# Patient Record
Sex: Female | Born: 1985 | Race: White | Hispanic: No | Marital: Married | Smoking: Current every day smoker
Health system: Southern US, Community
[De-identification: ages and names within clinical notes are randomized; demographics above are authoritative.]

## PROBLEM LIST (undated history)

## (undated) HISTORY — PX: WISDOM TOOTH EXTRACTION: SHX21

## (undated) HISTORY — PX: TUBAL LIGATION: SHX77

---
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
## 2004-09-13 ENCOUNTER — Emergency Department (HOSPITAL_COMMUNITY): Admission: EM | Admit: 2004-09-13 | Discharge: 2004-09-13 | Payer: Self-pay | Admitting: Emergency Medicine

## 2004-10-16 ENCOUNTER — Emergency Department (HOSPITAL_COMMUNITY): Admission: EM | Admit: 2004-10-16 | Discharge: 2004-10-16 | Payer: Self-pay | Admitting: Emergency Medicine

## 2005-02-02 ENCOUNTER — Inpatient Hospital Stay (HOSPITAL_COMMUNITY): Admission: AD | Admit: 2005-02-02 | Discharge: 2005-02-02 | Payer: Self-pay | Admitting: Obstetrics & Gynecology

## 2005-03-12 ENCOUNTER — Emergency Department (HOSPITAL_COMMUNITY): Admission: EM | Admit: 2005-03-12 | Discharge: 2005-03-12 | Payer: Self-pay | Admitting: Emergency Medicine

## 2005-03-25 ENCOUNTER — Other Ambulatory Visit: Admission: RE | Admit: 2005-03-25 | Discharge: 2005-03-25 | Payer: Self-pay | Admitting: Obstetrics and Gynecology

## 2005-08-14 ENCOUNTER — Inpatient Hospital Stay (HOSPITAL_COMMUNITY): Admission: AD | Admit: 2005-08-14 | Discharge: 2005-08-14 | Payer: Self-pay | Admitting: Obstetrics and Gynecology

## 2005-08-19 ENCOUNTER — Inpatient Hospital Stay (HOSPITAL_COMMUNITY): Admission: AD | Admit: 2005-08-19 | Discharge: 2005-08-22 | Payer: Self-pay | Admitting: Obstetrics and Gynecology

## 2005-10-27 ENCOUNTER — Ambulatory Visit (HOSPITAL_COMMUNITY): Admission: RE | Admit: 2005-10-27 | Discharge: 2005-10-27 | Payer: Self-pay | Admitting: Obstetrics and Gynecology

## 2007-03-16 ENCOUNTER — Emergency Department (HOSPITAL_COMMUNITY): Admission: EM | Admit: 2007-03-16 | Discharge: 2007-03-16 | Payer: Self-pay | Admitting: Emergency Medicine

## 2009-08-03 ENCOUNTER — Emergency Department (HOSPITAL_COMMUNITY): Admission: EM | Admit: 2009-08-03 | Discharge: 2009-08-03 | Payer: Self-pay | Admitting: Emergency Medicine

## 2010-07-26 NOTE — Op Note (Signed)
NAMEJOANELL, CRESSLER           ACCOUNT NO.:  [id number]   MEDICAL RECORD NO.:  [id number]          PATIENT TYPE:  AMB   LOCATION:  SDC                           FACILITY:  [facility]   PHYSICIAN:  Duke Salvia. Marcelle Overlie, M.D.DATE OF BIRTH:  06-14-1985   DATE OF PROCEDURE:  10/27/2005  DATE OF DISCHARGE:                                 OPERATIVE REPORT   PREOPERATIVE DIAGNOSIS:  Request for permanent sterilization.   POSTOPERATIVE DIAGNOSIS:  Request for permanent sterilization.   PROCEDURE:  Laparoscopic tubal ligation by Filshie clip application.   SURGEON:  [place].   ANESTHESIA:  General endotracheal.   COMPLICATIONS:  None.   DRAINS:  In and out Foley.   BLOOD LOSS:  Minimal.   SPECIMENS REMOVED:  None.   PROCEDURE AND FINDINGS:  The patient was taken to the operating room after  an adequate level of general endotracheal anesthesia was obtained with the  patient legs in stirrups.  The abdomen, perineum and vagina were prepped and  draped.  Bladder was drained with an in-and-out Foley, EUA carried out.  Uterus normal size, mid position, mobile, adnexa negative.  Hulka tenaculum  was positioned.  Attention directed to the subumbilical area.  Small amount  of Marcaine was injected.  A small incision was made, and the Veress needle  was introduced without difficulty.  Its intra-abdominal position verified by  pressure and water testing.  After 2-1/2 liter pneumoperitoneum was then  created, laparoscopic trocar and sleeve were then introduced without  difficulty.  There was no evidence any bleeding or trauma.  The patient was  then placed in Trendelenburg.  Pelvis was inspected and noted be normal.  Forty-five mL of half percent Marcaine plain was then dripped across the  tube from the cornu to the fimbriated end.  Filshie clip applicator was back  loaded.  The right tube was identified, traced to its fimbriated end.  It  was regrasped 2 cm from the cornu and a clip applied and a  right angle,  completely engulfing the tube with excellent application and the exact same  repeated on the opposite side.  Photos were taken for documentation.  There  was no bleeding.  Instruments were removed.  The gas was allowed to escape.  Defect closed with 4-0 Dexon subcuticular suture.  She tolerated this well  and went to recovery room in good condition.     Richard M. Marcelle Overlie, M.D.  Electronically Signed    [facility]/MEDQ  D:  10/27/2005  T:  10/27/2005  Job:  [id number]

## 2010-07-26 NOTE — H&P (Signed)
Sherri Oliver, SLAWSON                     ACCOUNT NO.:  [id number]   MEDICAL RECORD NO.:  [id number]                   PATIENT TYPE:  INP   LOCATION:  1829                                 FACILITY:  [facility]   PHYSICIAN:  Sherri Oliver, M.D.                 DATE OF BIRTH:  01/04/1986   DATE OF ADMISSION:  12/22/2002  DATE OF DISCHARGE:                                HISTORY & PHYSICAL   CHIEF COMPLAINT:  Right lower quadrant pain, fever and chills.   HISTORY OF PRESENT ILLNESS:  Sherri Oliver is an 25 year old woman with a  past medical history significant for pyelonephritis and bipolar disorder,  who presented to the emergency department with a two-and-a-half-day history  of right-sided lower abdominal pain that radiated to the back, fever and  chills.  She started having nausea and vomiting x2-3 earlier today.  She  almost passed out at work today after getting very dizzy and weak.  She  denies any painful urination, however, she has had an increase in urinary  frequency.  She has not had urinary urgency.  She denies any vaginal  discharge or pain with urination.  The pain in her right lower abdomen was  ranked as 7-8/10 earlier; now it is approximately 5/10.  The pain  intensifies during the exam but when she is lying still, it is not quite as  bad.  She was able to drink the water at work today and she was able to keep  water down.  Surprisingly, the patient worked at [employer]  throughout the course of the day, even with the fever, chills, nausea,  vomiting and right-sided abdominal pain.  Her nausea and vomiting actually  started at approximately 8:30 the morning of December 21, 2002.  The patient  has not had any complaints of stiff neck, chest pain, upper respiratory  infection symptoms, shortness of breath, diarrhea, melena, hematochezia,  bloody urine or painful urination.  She denies any depression recently.  She  denies any suicidal thoughts recently.  She  denies any painful sex.  Her  review of systems is positive for irregular periods.  She generally spots  for a few days each month.  She delivered her first and only baby in April  of 2003.  She is followed by gynecologist, Dr. Duke Oliver. [place].   When the patient was evaluated in the emergency department, a CT scan of the  abdomen and pelvis was ordered.  Per the emergency department physician, Dr.  Radford Oliver, Dr. Thornton Oliver. [facility] Oliver, Careers adviser, was consulted.  Dr. Daphine Oliver evaluated  the CT scan with the radiologist.  Their assessment was that the patient had  changes consistent with right pyelonephritis.  Her temperature on  presentation was 103.2.  Her white blood cell count was 12.2.  Her  urinalysis was positive for large hemoglobin, greater than 80 ketones, large  leukocyte esterase, wbc's too numerous to count, and many  bacteria.  The  patient will be therefore admitted for evaluation and management of  pyelonephritis.   PAST MEDICAL HISTORY:  1. History of pyelonephritis in the past.  2. History of bipolar disorder.     a. Attempted suicide with Depakote and other medicines in the past (two        to three years ago).     b. Inpatient treatment at [facility] two to three years ago.  3. History of fractured coccyx in the past.  4. Gravida I, para I in April of 2003.   MEDICATIONS:  1. Depo-Provera every (q) 3 months.  2. Ibuprofen as needed.   ALLERGIES:  DEPAKOTE.   SOCIAL HISTORY:  The patient is single.  She is employed at [employer].  She [place]  The father of the baby and her son live there as well.  She also  resides with her two brothers and one sister.  She completed the 8th grade.  She can read and write.  She denies tobacco, alcohol and drug use.   FAMILY HISTORY:  Her mother is 77 years of age and has no significant past  medical history.  The health of her biological father is  unknown.   REVIEW OF SYSTEMS:  Review of systems as above in the history of present  illness.  In addition, the patient denies hematemesis or coffee-grounds  emesis.   EXAMINATION:  VITAL SIGNS:  Temperature 103.2, pulse 132, respiratory rate  18, blood pressure 112/65, oxygen saturation 96% on room air.  GENERAL:  The patient is an 25 year old white female of average weight, who  is alert and appears ill.  She currently has chills.  HEENT:  Head is normocephalic and nontraumatic.  Pupils are equal, round and  reactive to light.  Extraocular movement are intact.  Tympanic membranes are  clear bilaterally.  Nasal mucosa is moist, no drainage.  Oropharynx reveals  mildly dry mucous membranes.  She does have a pierced tongue.  Dentition is  good.  No posterior exudates or erythema.  NECK:  Neck is supple.  No adenopathy, no thyromegaly.  LUNGS:  Lungs are clear to auscultation bilaterally.  HEART:  S1, S2, with mild tachycardia.  ABDOMEN:  Hypoactive bowel sounds.  She has moderate right lower quadrant  pain as well as moderate right flank pain.  She is nontender elsewhere.  No  masses palpated.  No rigidity nor rebound.  Her abdomen, in general, is  soft.  GU:  Deferred.  RECTAL:  Deferred.  EXTREMITIES:  The patient has no joint abnormalities throughout.  Pedal  pulses are 2+ bilaterally.  No pretibial edema and no pedal edema.  Good  movement of all joints.  NEUROLOGIC:  The patient is alert and oriented x3.  Cranial nerves II-XII  are intact.  Strength is intact, 5/5 throughout.  Sensation is intact  throughout.   ADMISSION LABORATORIES:  CT scan of the abdomen and pelvis:  Preliminary  reading per Dr. Radford Oliver, changes consistent with right pyelonephritis.  A  final reading is pending per radiologist.   Urine pregnancy test negative.  Sodium 136, potassium 3.0, chloride 104, BUN 6, glucose 111.  Urine pregnancy test negative.  Urinalysis cloudy in  appearance, urine hemoglobin  large, urine ketones greater than 80, urine  protein 100, urine nitrites negative, urine leukocyte esterase large, urine  wbc's too numerous to count, urine rbc's 7 to 10, bacteria many.  WBC 12.2,  hemoglobin 13.2, hematocrit 38.6, MCV 86.5, platelets 183,000, absolute  neutrophil count 10.3.   ASSESSMENT:  1. Right pyelonephritis.  The patient's presentation with fever, right lower     quadrant pain, pyuria and leukocytosis all point to the diagnosis of     pyelonephritis.  The patient's presentation will require inpatient     treatment.  2. Hypokalemia.  Etiology uncertain at this time, however, it could be     secondary to the nausea and vomiting she had earlier.  3. History of bipolar disorder.  The patient has not had any relapses in the     past two years.  She is completely off of her medications.  She has had     no recent history of depression, mania or any thoughts of suicide.   PLAN:  1. The patient will be admitted to a regular bed.  2. Volume repletion with normal saline at 125 mL an hour with 30 mEq of     potassium chloride added.  3. Will treat infection with Cipro 400 mg IV q.12 h.  4. Will check blood cultures and urine cultures.  5. Morphine as needed for pain.  Phenergan as needed for nausea and     vomiting.  6. We will start a clear liquid diet and try to replete the potassium orally     when she can tolerate it.  7. We will check a magnesium and phosphorus level.  8. We will follow up with lab tests in the morning.                                                Sherri Oliver, M.D.    DF/MEDQ  D:  12/22/2002  T:  12/22/2002  Job:  [id number]

## 2010-07-26 NOTE — H&P (Signed)
Sherri Oliver, Sherri Oliver           ACCOUNT NO.:  [id number]   MEDICAL RECORD NO.:  [id number]          PATIENT TYPE:  AMB   LOCATION:  SDC                           [facility]   PHYSICIAN:  Duke Salvia. Marcelle Overlie, M.D.DATE OF BIRTH:  08-Jun-1985   DATE OF ADMISSION:  10/27/2005  DATE OF DISCHARGE:                                HISTORY & PHYSICAL   CHIEF COMPLAINT:  Requests permanent sterilization.   HISTORY OF PRESENT ILLNESS:  A 25 year old G2 now P2 presents for permanent  sterilization.  She is sure she would not to be pregnant again under any  circumstance.  We have reviewed this option with her over time and she  remains firm in her decision rejecting other reversible forms of  contraception.  This procedure including the risks of bleeding, infection,  adjacent organ injury, the possible need for open or additional surgery, the  permanence of the procedure and failure rate of 2-3 per thousand all  discussed which she understands and accepts.   ALLERGIES:  DEPAKOTE.   OBSTETRICAL HISTORY:  Two vaginal deliveries at term.   REVIEW OF SYSTEMS:  Significant for a history of bipolar disorder.  She has  been off meds for approximately 5 years.  She has also had UTIs in the past.   FAMILY HISTORY:  Significant for a grandfather with heart disease and  several aunts with breast cancer.   PHYSICAL EXAMINATION:  VITAL SIGNS:  Temp 98.2, blood pressure 120/72.  HEENT:  Unremarkable.  NECK:  Supple without masses.  LUNGS:  Clear.  CARDIOVASCULAR:  Regular rate and rhythm without murmurs, rubs or gallops.  BREASTS:  Without masses.  ABDOMEN:  Soft, flat, nontender.  PELVIC EXAM:  Normal external genitalia.  Vagina _and cervix clear.  Uterus  mid positional size, adnexa negative.   IMPRESSION:  Normal exam, requests permanent sterilization.   PLAN:  Tubal ligation by Filshie clip application.  Procedure and risks  reviewed as above.      Richard M. Marcelle Overlie, M.D.  Electronically Signed     [facility]/MEDQ  D:  10/23/2005  T:  10/23/2005  Job:  [id number]

## 2010-07-26 NOTE — Discharge Summary (Signed)
[name], [name]                     ACCOUNT NO.:  [id number]   MEDICAL RECORD NO.:  [id number]                   PATIENT TYPE:  INP   LOCATION:  6122                                 FACILITY:  [facility]   PHYSICIAN:  Alfonse Spruce, M.D.               DATE OF BIRTH:  06/15/1985   DATE OF ADMISSION:  12/21/2002  DATE OF DISCHARGE:  12/25/2002                                 DISCHARGE SUMMARY   FINAL DIAGNOSES:  1. Acute pyelonephritis.  2. Urinary tract infection with Escherichia coli.  3. Anemia, iron deficiency.  4. Hypotension which has been corrected.   MEDICATIONS ON DISCHARGE:  1. Cipro 500 mg p.o. b.i.d. for 10 days.  2. Multivitamin tablets once a day with mineral.  3. Ferrous sulfate 325 mg tablet t.i.d. with food.  4. Tylenol 650 mg q.4-6h. p.r.n.   HISTORY AND HOSPITAL COURSE:  Ms. Sherri Oliver is an 25 year old white  female admitted to the hospital through the emergency room with severe flank  pain, fever, chills, and associated with severe nausea and vomiting. At the  time of admission she had temperature of 103.6 with hypotension, which has  been treated. The patient also had, during her hospital stay, mild  hypokalemia that was corrected.  Blood culture was obtained, which was  negative. Urine culture showed Escherichia coli and the patient was started  on IV antibiotics, which culture showed E. coli as mentioned above, with the  presence, in the urinalysis, of many bacteria, WBC too numerous to count,  leukocyte esterase large. As the patient did well, effervescent, no  temperature, and IV antibiotics was started on the 13th and ended the last  dose on 16th as the IV infiltrated, and subsequently started on p.o. Cipro.  The patient was afebrile today, doing well, able to eat. Her blood pressure  has improved to 124/67. She was started on iron yesterday and her  temperature is 37.2. Her serum iron was less than 10 with a hemoglobin of  10.4,  hematocrit 30.8, white count 6.1 on the 16th.  The patient is advised  to see her physician who is assigned to her by Medicaid. She is to follow up  and especially she should not be working and given a prescription for that.  She is off work until she sees her physician.  She is to continue the  medication above. The patient is stable on discharge and is to be followed  by her PCP assigned by Medicaid. Her prognosis is good.  Further followup in  regard of the underlying pyelonephritis which was also diagnosed by CAT scan  of the abdomen done at the time of admission was multifocal involvement of  the right kidney.  Alfonse Spruce, M.D.    Wynn Maudlin  D:  12/25/2002  T:  12/25/2002  Job:  [id number]

## 2010-08-23 ENCOUNTER — Emergency Department (HOSPITAL_COMMUNITY)
Admission: EM | Admit: 2010-08-23 | Discharge: 2010-08-23 | Disposition: A | Discharge status: Home / Self Care | Payer: Self-pay | Attending: Emergency Medicine | Admitting: Emergency Medicine

## 2010-08-23 DIAGNOSIS — F319 Bipolar disorder, unspecified: Secondary | ICD-10-CM | POA: Insufficient documentation

## 2010-08-23 DIAGNOSIS — R300 Dysuria: Secondary | ICD-10-CM | POA: Insufficient documentation

## 2010-08-23 DIAGNOSIS — N12 Tubulo-interstitial nephritis, not specified as acute or chronic: Secondary | ICD-10-CM | POA: Insufficient documentation

## 2010-08-23 DIAGNOSIS — R1031 Right lower quadrant pain: Secondary | ICD-10-CM | POA: Insufficient documentation

## 2010-08-23 DIAGNOSIS — Z79899 Other long term (current) drug therapy: Secondary | ICD-10-CM | POA: Insufficient documentation

## 2010-08-23 LAB — URINALYSIS, ROUTINE W REFLEX MICROSCOPIC
Bilirubin Urine: NEGATIVE
Glucose, UA: NEGATIVE mg/dL
Ketones, ur: NEGATIVE mg/dL
Nitrite: NEGATIVE
Protein, ur: 30 mg/dL — AB

## 2010-08-23 LAB — URINE MICROSCOPIC-ADD ON

## 2010-08-23 LAB — POCT PREGNANCY, URINE: Preg Test, Ur: NEGATIVE

## 2010-08-25 LAB — URINE CULTURE
Colony Count: >=100000
Culture  Setup Time: 201206151134

## 2010-11-27 LAB — URINALYSIS, ROUTINE W REFLEX MICROSCOPIC
Bilirubin Urine: NEGATIVE
Ketones, ur: NEGATIVE
Nitrite: NEGATIVE
Specific Gravity, Urine: 1.007
pH: 6

## 2010-11-27 LAB — URINE MICROSCOPIC-ADD ON

## 2010-11-27 LAB — POCT PREGNANCY, URINE
Operator id: 294341
Preg Test, Ur: NEGATIVE

## 2011-01-24 ENCOUNTER — Encounter: Payer: Self-pay | Admitting: Emergency Medicine

## 2011-01-24 ENCOUNTER — Emergency Department (HOSPITAL_COMMUNITY)
Admission: EM | Admit: 2011-01-24 | Discharge: 2011-01-24 | Disposition: A | Discharge status: Home / Self Care | Payer: Self-pay | Attending: Emergency Medicine | Admitting: Emergency Medicine

## 2011-01-24 DIAGNOSIS — N390 Urinary tract infection, site not specified: Secondary | ICD-10-CM | POA: Insufficient documentation

## 2011-01-24 DIAGNOSIS — R109 Unspecified abdominal pain: Secondary | ICD-10-CM | POA: Insufficient documentation

## 2011-01-24 LAB — URINALYSIS, ROUTINE W REFLEX MICROSCOPIC
Bilirubin Urine: NEGATIVE
Ketones, ur: NEGATIVE mg/dL
Nitrite: POSITIVE — AB
Protein, ur: NEGATIVE mg/dL
Urobilinogen, UA: 1 mg/dL (ref 0.0–1.0)

## 2011-01-24 LAB — URINE MICROSCOPIC-ADD ON

## 2011-01-24 MED ORDER — ONDANSETRON 4 MG PO TBDP
4.0000 mg | ORAL_TABLET | Freq: Once | ORAL | Status: AC
  Administered 2011-01-24: 4 mg via ORAL
  Filled 2011-01-24: qty 1

## 2011-01-24 MED ORDER — CIPROFLOXACIN HCL 500 MG PO TABS: 500.0000 mg | ORAL_TABLET | Freq: Two times a day (BID) | ORAL | Status: AC

## 2011-01-24 MED ORDER — PROMETHAZINE HCL 25 MG PO TABS: 25.0000 mg | ORAL_TABLET | Freq: Four times a day (QID) | ORAL | Status: AC | PRN

## 2011-01-24 MED ORDER — CIPROFLOXACIN HCL 500 MG PO TABS
500.0000 mg | ORAL_TABLET | Freq: Once | ORAL | Status: AC
  Administered 2011-01-24: 500 mg via ORAL
  Filled 2011-01-24: qty 1

## 2011-01-24 NOTE — ED Notes (Signed)
C/o pain to L side x 4 days.  Reports nausea and vomiting today.  Pt states she feels like she needs to urinate but can't.

## 2011-01-27 LAB — URINE CULTURE
Colony Count: >=100000
Culture  Setup Time: 201211162259

## 2011-01-28 NOTE — ED Notes (Signed)
+   urine Patient treated with Cipro-sensitive to same-chart appended per protocol MD. 

## 2011-03-07 ENCOUNTER — Emergency Department (HOSPITAL_COMMUNITY)
Admission: EM | Admit: 2011-03-07 | Discharge: 2011-03-08 | Disposition: A | Discharge status: Home / Self Care | Payer: Self-pay | Attending: Emergency Medicine | Admitting: Emergency Medicine

## 2011-03-07 ENCOUNTER — Encounter (HOSPITAL_COMMUNITY): Payer: Self-pay | Admitting: Emergency Medicine

## 2011-03-07 DIAGNOSIS — N390 Urinary tract infection, site not specified: Secondary | ICD-10-CM | POA: Insufficient documentation

## 2011-03-07 DIAGNOSIS — R6883 Chills (without fever): Secondary | ICD-10-CM | POA: Insufficient documentation

## 2011-03-07 DIAGNOSIS — R112 Nausea with vomiting, unspecified: Secondary | ICD-10-CM | POA: Insufficient documentation

## 2011-03-07 DIAGNOSIS — B349 Viral infection, unspecified: Secondary | ICD-10-CM

## 2011-03-07 DIAGNOSIS — R197 Diarrhea, unspecified: Secondary | ICD-10-CM | POA: Insufficient documentation

## 2011-03-07 NOTE — ED Notes (Signed)
Pt st's she has had cold symptoms with chills since 12/24.  Today has had nausea, vomiting and diarrhea

## 2011-03-08 LAB — URINALYSIS, ROUTINE W REFLEX MICROSCOPIC
Bilirubin Urine: NEGATIVE
Glucose, UA: NEGATIVE mg/dL
Ketones, ur: NEGATIVE mg/dL
Nitrite: POSITIVE — AB
Protein, ur: NEGATIVE mg/dL
Specific Gravity, Urine: 1.026 (ref 1.005–1.030)
Urobilinogen, UA: 0.2 mg/dL (ref 0.0–1.0)
pH: 5.5 (ref 5.0–8.0)

## 2011-03-08 LAB — POCT I-STAT, CHEM 8
BUN: 7 mg/dL (ref 6–23)
Calcium, Ion: 1.12 mmol/L (ref 1.12–1.32)
Chloride: 104 meq/L (ref 96–112)
Creatinine, Ser: 0.9 mg/dL (ref 0.50–1.10)
Glucose, Bld: 86 mg/dL (ref 70–99)
HCT: 40 % (ref 36.0–46.0)
Hemoglobin: 13.6 g/dL (ref 12.0–15.0)
Potassium: 4.3 meq/L (ref 3.5–5.1)
Sodium: 142 meq/L (ref 135–145)
TCO2: 28 mmol/L (ref 0–100)

## 2011-03-08 LAB — URINE MICROSCOPIC-ADD ON

## 2011-03-08 MED ORDER — PROMETHAZINE HCL 25 MG PO TABS: 25.0000 mg | ORAL_TABLET | Freq: Four times a day (QID) | ORAL | Status: AC | PRN

## 2011-03-08 MED ORDER — NITROFURANTOIN MONOHYD MACRO 100 MG PO CAPS: 100.0000 mg | ORAL_CAPSULE | Freq: Two times a day (BID) | ORAL | Status: AC

## 2011-03-13 NOTE — ED Provider Notes (Signed)
History     CSN: [id number]  Arrival date & time 03/07/11  2132   First MD Initiated Contact with Patient 03/08/11 0235      Chief Complaint  Patient presents with  . Emesis    HPI: Patient is a 26 y.o. female presenting with vomiting. The history is provided by the patient.  Emesis  This is a new problem. The current episode started more than 2 days ago. The problem occurs 2 to 4 times per day. The problem has been gradually worsening. The emesis has an appearance of stomach contents. Associated symptoms include chills, diarrhea and URI.  Pt reports several days of cold like symptoms chills. Denies cough or known fever. Today had onset of N/V/D. Reports 2 to 3 episodes of each since onset. Denies abd pain, UTI symptoms or vaginal d/c.  History reviewed. No pertinent past medical history.  Past Surgical History  Procedure Date  . Tubal ligation   . Wisdom tooth extraction     No family history on file.  History  Substance Use Topics  . Smoking status: Current Everyday Smoker  . Smokeless tobacco: Not on file  . Alcohol Use: No    OB History    Grav Para Term Preterm Abortions TAB SAB Ect Mult Living                  Review of Systems  Constitutional: Positive for chills.  HENT: Negative.   Eyes: Negative.   Respiratory: Negative.   Cardiovascular: Negative.   Gastrointestinal: Positive for vomiting and diarrhea.  Genitourinary: Negative.   Musculoskeletal: Negative.   Skin: Negative.   Neurological: Negative.   Hematological: Negative.   Psychiatric/Behavioral: Negative.     Allergies  Depakote  Home Medications   Current Outpatient Rx  Name Route Sig Dispense Refill  . NITROFURANTOIN MONOHYD MACRO 100 MG PO CAPS Oral Take 1 capsule (100 mg total) by mouth 2 (two) times daily. 14 capsule 0  . PROMETHAZINE HCL 25 MG PO TABS Oral Take 1 tablet (25 mg total) by mouth every 6 (six) hours as needed for nausea. 10 tablet 0    BP 109/68  Pulse 69   Temp(Src) 97.8 F (36.6 C) (Oral)  Resp 18  SpO2 99%  LMP 03/02/2011  Physical Exam  Constitutional: She is oriented to person, place, and time. She appears well-developed and well-nourished.  HENT:  Head: Normocephalic and atraumatic.  Eyes: Conjunctivae are normal.  Neck: Neck supple.  Cardiovascular: Normal rate and regular rhythm.   Pulmonary/Chest: Effort normal and breath sounds normal.  Abdominal: Soft. Bowel sounds are normal.  Genitourinary:       No CVA TTP  Musculoskeletal: Normal range of motion.  Neurological: She is alert and oriented to person, place, and time.  Skin: Skin is warm and dry. No erythema.  Psychiatric: She has a normal mood and affect.    ED Course  Procedures  Findings and clinical impression discussed w/ pt. No further vomiting and diarrhea since arrival to ED. Tolerating PO fluids. Will plan for d/c home w/ medication for nausea and tx for UTI, and encourage pt to arrange f/u w/ a PCP or Urgent Care Facility in approx 1 week for recheck of urine. Pt agreeable w/ plan.  Labs Reviewed  URINALYSIS, ROUTINE W REFLEX MICROSCOPIC - Abnormal; Notable for the following:    Color, Urine AMBER (*) BIOCHEMICALS MAY BE AFFECTED BY COLOR   APPearance CLOUDY (*)    Hgb urine dipstick LARGE (*)  Nitrite POSITIVE (*)    Leukocytes, UA MODERATE (*)    All other components within normal limits  URINE MICROSCOPIC-ADD ON - Abnormal; Notable for the following:    Squamous Epithelial / LPF FEW (*)    Bacteria, UA MANY (*)    All other components within normal limits  POCT I-STAT, CHEM 8  POCT PREGNANCY, URINE  LAB REPORT - SCANNED   No results found.   1. Vomiting and diarrhea   2. Urinary tract infection       MDM  HPI/PE and clinical findings c/w Viral Syndrome and UTI Macrodantin sensitive in previous urine c/s.        Leanne Chang, NP 03/13/11 [phone]   Medical screening examination/treatment/procedure(s) were performed by  non-physician practitioner and as supervising physician I was immediately available for consultation/collaboration.  Sunnie Nielsen, MD 03/14/11 2012

## 2012-04-24 ENCOUNTER — Encounter (HOSPITAL_COMMUNITY): Payer: Self-pay | Admitting: *Deleted

## 2012-04-24 ENCOUNTER — Emergency Department (HOSPITAL_COMMUNITY)
Admission: EM | Admit: 2012-04-24 | Discharge: 2012-04-25 | Disposition: A | Discharge status: Home / Self Care | Payer: Self-pay | Attending: Emergency Medicine | Admitting: Emergency Medicine

## 2012-04-24 DIAGNOSIS — S0100XA Unspecified open wound of scalp, initial encounter: Secondary | ICD-10-CM | POA: Insufficient documentation

## 2012-04-24 DIAGNOSIS — F17200 Nicotine dependence, unspecified, uncomplicated: Secondary | ICD-10-CM | POA: Insufficient documentation

## 2012-04-24 DIAGNOSIS — Z23 Encounter for immunization: Secondary | ICD-10-CM | POA: Insufficient documentation

## 2012-04-24 DIAGNOSIS — S022XXA Fracture of nasal bones, initial encounter for closed fracture: Secondary | ICD-10-CM | POA: Insufficient documentation

## 2012-04-24 DIAGNOSIS — S0101XA Laceration without foreign body of scalp, initial encounter: Secondary | ICD-10-CM

## 2012-04-24 MED ORDER — TRAMADOL HCL 50 MG PO TABS
50.0000 mg | ORAL_TABLET | Freq: Once | ORAL | Status: AC
  Administered 2012-04-24: 50 mg via ORAL
  Filled 2012-04-24: qty 1

## 2012-04-24 MED ORDER — TETANUS-DIPHTH-ACELL PERTUSSIS 5-2.5-18.5 LF-MCG/0.5 IM SUSP
0.5000 mL | Freq: Once | INTRAMUSCULAR | Status: AC
  Administered 2012-04-24: 0.5 mL via INTRAMUSCULAR
  Filled 2012-04-24: qty 0.5
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
## 2012-04-25 ENCOUNTER — Emergency Department (HOSPITAL_COMMUNITY): Payer: Self-pay

## 2012-04-25 MED ORDER — AMOXICILLIN 500 MG PO CAPS: 500.0000 mg | ORAL_CAPSULE | Freq: Three times a day (TID) | ORAL | Status: DC

## 2012-04-25 MED ORDER — HYDROCODONE-ACETAMINOPHEN 5-325 MG PO TABS: 1.0000 | ORAL_TABLET | ORAL | Status: DC | PRN
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
## 2015-11-05 ENCOUNTER — Emergency Department (HOSPITAL_COMMUNITY)
Admission: EM | Admit: 2015-11-05 | Discharge: 2015-11-06 | Disposition: A | Discharge status: Home / Self Care | Payer: BLUE CROSS/BLUE SHIELD | Attending: Emergency Medicine | Admitting: Emergency Medicine

## 2015-11-05 ENCOUNTER — Encounter (HOSPITAL_COMMUNITY): Payer: Self-pay | Admitting: Emergency Medicine

## 2015-11-05 DIAGNOSIS — N390 Urinary tract infection, site not specified: Secondary | ICD-10-CM | POA: Diagnosis not present

## 2015-11-05 DIAGNOSIS — E860 Dehydration: Secondary | ICD-10-CM

## 2015-11-05 DIAGNOSIS — Z7982 Long term (current) use of aspirin: Secondary | ICD-10-CM | POA: Diagnosis not present

## 2015-11-05 DIAGNOSIS — F17210 Nicotine dependence, cigarettes, uncomplicated: Secondary | ICD-10-CM | POA: Diagnosis not present

## 2015-11-05 DIAGNOSIS — R55 Syncope and collapse: Secondary | ICD-10-CM | POA: Diagnosis present

## 2015-11-05 LAB — CBC
HEMATOCRIT: 41.7 % (ref 36.0–46.0)
HEMOGLOBIN: 14.2 g/dL (ref 12.0–15.0)
MCH: 32.4 pg (ref 26.0–34.0)
MCHC: 34.1 g/dL (ref 30.0–36.0)
MCV: 95.2 fL (ref 78.0–100.0)
Platelets: 270 10*3/uL (ref 150–400)
RBC: 4.38 MIL/uL (ref 3.87–5.11)
RDW: 13.5 % (ref 11.5–15.5)
WBC: 9.2 10*3/uL (ref 4.0–10.5)

## 2015-11-05 LAB — BASIC METABOLIC PANEL
ANION GAP: 5 (ref 5–15)
BUN: 9 mg/dL (ref 6–20)
CO2: 28 mmol/L (ref 22–32)
Calcium: 9.3 mg/dL (ref 8.9–10.3)
Chloride: 107 mmol/L (ref 101–111)
Creatinine, Ser: 0.66 mg/dL (ref 0.44–1.00)
GFR calc Af Amer: >60 mL/min (ref >60)
GFR calc non Af Amer: >60 mL/min (ref >60)
GLUCOSE: 81 mg/dL (ref 65–99)
POTASSIUM: 3.6 mmol/L (ref 3.5–5.1)
SODIUM: 140 mmol/L (ref 135–145)

## 2015-11-05 LAB — URINALYSIS, ROUTINE W REFLEX MICROSCOPIC
BILIRUBIN URINE: NEGATIVE
GLUCOSE, UA: NEGATIVE mg/dL
Ketones, ur: NEGATIVE mg/dL
Leukocytes, UA: NEGATIVE
Nitrite: POSITIVE — AB
PH: 6.5 (ref 5.0–8.0)
Protein, ur: NEGATIVE mg/dL
SPECIFIC GRAVITY, URINE: 1.015 (ref 1.005–1.030)

## 2015-11-05 LAB — URINE MICROSCOPIC-ADD ON: Squamous Epithelial / LPF: NONE SEEN

## 2015-11-05 LAB — PREGNANCY, URINE: Preg Test, Ur: NEGATIVE

## 2015-11-05 LAB — CBG MONITORING, ED: Glucose-Capillary: 93 mg/dL (ref 65–99)

## 2015-11-05 MED ORDER — NITROFURANTOIN MONOHYD MACRO 100 MG PO CAPS: 100.0000 mg | ORAL_CAPSULE | Freq: Two times a day (BID) | ORAL | 0 refills | Status: DC

## 2015-11-05 MED ORDER — SODIUM CHLORIDE 0.9 % IV BOLUS (SEPSIS)
1000.0000 mL | Freq: Once | INTRAVENOUS | Status: AC
  Administered 2015-11-05: 1000 mL via INTRAVENOUS

## 2015-11-05 NOTE — ED Triage Notes (Addendum)
Pt states last week she was standing in line during a concert and about 1800 she passed out. Pt states earlier that morning when she woke up she felt lightheaded so she sat down and it subsided. Pt says that hands and face feel numb and tingling in her lips. Pt says that her hand stay cold. Capillary refill <3 with strong pulses.

## 2015-11-05 NOTE — Discharge Instructions (Signed)
Increase your fluid intake and rest as much as possible.  Follow-up with a primary care doctor

## 2015-11-06 NOTE — ED Provider Notes (Signed)
[facility]-EMERGENCY DEPT Provider Note   CSN: [id number] Arrival date & time: 11/05/15  1522     History   Chief Complaint Chief Complaint  Patient presents with  . Near Syncope    HPI Sherri Oliver is a 30 y.o. female.  HPI Patient presents to the emergency department with near syncope, dizziness, or the last week.  The patient states last week she was at a concert when she had a syncopal episode.  Patient states that since that she has been feeling these other symptoms.  The patient states that nothing seems make the condition better.  She states that his vision changes, does seem to affect the dizziness.  Patient states that she did not take any medications prior to arrival today. The patient denies chest pain, shortness of breath, headache,blurred vision, neck pain, fever, cough, weakness, numbness, , anorexia, edema, abdominal pain, nausea, vomiting, diarrhea, rash, back pain, dysuria, hematemesis, bloody stool, near syncope, or syncope. History reviewed. No pertinent past medical history.  There are no active problems to display for this patient.   Past Surgical History:  Procedure Laterality Date  . TUBAL LIGATION    . WISDOM TOOTH EXTRACTION    . WISDOM TOOTH EXTRACTION      OB History    No data available       Home Medications    Prior to Admission medications   Medication Sig Start Date End Date Taking? Authorizing Provider  amoxicillin (AMOXIL) 500 MG capsule Take 1 capsule (500 mg total) by mouth 3 (three) times daily. 04/25/12   April Palumbo, MD  Aspirin-Acetaminophen-Caffeine (EXCEDRIN PO) Take 1 tablet by mouth every 8 (eight) hours as needed (for pain).    Historical Provider, MD  HYDROcodone-acetaminophen (NORCO/VICODIN) 5-325 MG per tablet Take 1 tablet by mouth every 4 (four) hours as needed for pain. 04/25/12   April Palumbo, MD  nitrofurantoin, macrocrystal-monohydrate, (MACROBID) 100 MG capsule Take 1 capsule (100 mg total) by mouth 2 (two) times  daily. 11/05/15   Charlestine Night, PA-C    Family History No family history on file.  Social History Social History  Substance Use Topics  . Smoking status: Current Every Day Smoker    Packs/day: 0.50    Types: Cigarettes  . Smokeless tobacco: Not on file  . Alcohol use No     Allergies   Divalproex sodium   Review of Systems Review of Systems All other systems negative except as documented in the HPI. All pertinent positives and negatives as reviewed in the HPI.  Physical Exam Updated Vital Signs BP 99/61 (BP Location: Left Arm)   Pulse 68   Temp 98.2 F (36.8 C) (Oral)   Resp 14   Ht 5\' 5"  (1.651 m)   SpO2 98%   Physical Exam  Constitutional: She is oriented to person, place, and time. She appears well-developed and well-nourished. No distress.  HENT:  Head: Normocephalic and atraumatic.  Mouth/Throat: Oropharynx is clear and moist.  Eyes: Pupils are equal, round, and reactive to light.  Neck: Normal range of motion. Neck supple.  Cardiovascular: Normal rate, regular rhythm and normal heart sounds.  Exam reveals no gallop and no friction rub.   No murmur heard. Pulmonary/Chest: Effort normal and breath sounds normal. No respiratory distress. She has no wheezes.  Abdominal: Soft. Bowel sounds are normal. She exhibits no distension. There is no tenderness.  Neurological: She is alert and oriented to person, place, and time. She exhibits normal muscle tone. Coordination normal.  Skin:  Skin is warm and dry. No rash noted. No erythema.  Psychiatric: She has a normal mood and affect. Her behavior is normal.  Nursing note and vitals reviewed.    ED Treatments / Results  Labs (all labs ordered are listed, but only abnormal results are displayed) Labs Reviewed  URINALYSIS, ROUTINE W REFLEX MICROSCOPIC (NOT [facility]) - Abnormal; Notable for the following:       Result Value   APPearance CLOUDY (*)    Hgb urine dipstick MODERATE (*)    Nitrite POSITIVE (*)    All  other components within normal limits  URINE MICROSCOPIC-ADD ON - Abnormal; Notable for the following:    Bacteria, UA MANY (*)    All other components within normal limits  BASIC METABOLIC PANEL  CBC  PREGNANCY, URINE  CBG MONITORING, ED    EKG  EKG Interpretation None       Radiology No results found.  Procedures Procedures (including critical care time)  Medications Ordered in ED Medications  sodium chloride 0.9 % bolus 1,000 mL (0 mLs Intravenous Stopped 11/06/15 0001)     Initial Impression / Assessment and Plan / ED Course  I have reviewed the triage vital signs and the nursing notes.  Pertinent labs & imaging results that were available during my care of the patient were reviewed by me and considered in my medical decision making (see chart for details).  Clinical Course    Patient feels better following IV fluids.  We will treat for UTI.  Told to increase her fluid intake, rest as much possible.  Told to follow with a primary care doctor.  Patient agrees the plan and all questions were answered  Final Clinical Impressions(s) / ED Diagnoses   Final diagnoses:  Near syncope  Dehydration  UTI (lower urinary tract infection)    New Prescriptions Discharge Medication List as of 11/05/2015 11:51 PM    START taking these medications   Details  nitrofurantoin, macrocrystal-monohydrate, (MACROBID) 100 MG capsule Take 1 capsule (100 mg total) by mouth 2 (two) times daily., Starting Mon 11/05/2015, Print         Charlestine NightChristopher Iness Pangilinan, PA-C 11/06/15 0124    Lavera Guiseana Duo Liu, MD 11/06/15 [phone]

## 2017-06-23 ENCOUNTER — Encounter (HOSPITAL_COMMUNITY): Payer: Self-pay | Admitting: Emergency Medicine

## 2017-06-23 ENCOUNTER — Emergency Department (HOSPITAL_COMMUNITY)
Admission: EM | Admit: 2017-06-23 | Discharge: 2017-06-23 | Disposition: A | Discharge status: Home / Self Care | Payer: Medicaid Other | Attending: Emergency Medicine | Admitting: Emergency Medicine

## 2017-06-23 DIAGNOSIS — N63 Unspecified lump in unspecified breast: Secondary | ICD-10-CM

## 2017-06-23 DIAGNOSIS — F17210 Nicotine dependence, cigarettes, uncomplicated: Secondary | ICD-10-CM | POA: Insufficient documentation

## 2017-06-23 DIAGNOSIS — N6310 Unspecified lump in the right breast, unspecified quadrant: Secondary | ICD-10-CM | POA: Insufficient documentation

## 2017-06-23 DIAGNOSIS — N644 Mastodynia: Secondary | ICD-10-CM | POA: Diagnosis present

## 2017-06-23 NOTE — ED Triage Notes (Signed)
Patient c/o painful "golf ball size" lump to right breast x10 days. Denies drainage.

## 2017-06-23 NOTE — Discharge Instructions (Signed)
You have been given a referral to the breast clinic for further imaging of your right breast.  You will need to have follow-up with a primary care doctor as well.  [facility].  You have also been given a resource guide with low cost primary services in the area.  We recommend ibuprofen or Aleve for persistent discomfort.  Return for new or concerning symptoms.

## 2017-06-24 NOTE — ED Provider Notes (Signed)
[facility]-EMERGENCY DEPT Provider Note   CSN: [id number] Arrival date & time: 06/23/17  [id number]    History   Chief Complaint Chief Complaint  Patient presents with  . Breast Pain    HPI Heron NayJessica L Montanari is a 32 y.o. female.  32 year old female presents to the emergency department for evaluation of painful mass to her breast times 10 days.  She has noticed some swelling to her right breast with associated "golf ball size" nodule.  She denies any nipple discharge, overlying skin changes, associated fever.  No history of trauma to the area.  She has no primary care doctor with whom she can follow-up.  She has been taking NSAIDs for pain management with little improvement.     History reviewed. No pertinent past medical history.  There are no active problems to display for this patient.   Past Surgical History:  Procedure Laterality Date  . TUBAL LIGATION    . WISDOM TOOTH EXTRACTION    . WISDOM TOOTH EXTRACTION       OB History   None      Home Medications    Prior to Admission medications   Medication Sig Start Date End Date Taking? Authorizing Provider  acetaminophen (TYLENOL) 500 MG tablet Take 1,000 mg by mouth every 6 (six) hours as needed for mild pain.   Yes [provider]  Aspirin-Acetaminophen-Caffeine (EXCEDRIN PO) Take 1 tablet by mouth every 8 (eight) hours as needed (for pain).   Yes [provider]  amoxicillin (AMOXIL) 500 MG capsule Take 1 capsule (500 mg total) by mouth 3 (three) times daily. Patient not taking: Reported on 06/23/2017 04/25/12   Palumbo, April, MD  HYDROcodone-acetaminophen (NORCO/VICODIN) 5-325 MG per tablet Take 1 tablet by mouth every 4 (four) hours as needed for pain. Patient not taking: Reported on 06/23/2017 04/25/12   Palumbo, April, MD  nitrofurantoin, macrocrystal-monohydrate, (MACROBID) 100 MG capsule Take 1 capsule (100 mg total) by mouth 2 (two) times daily. Patient not taking: Reported  on 06/23/2017 11/05/15   Charlestine NightLawyer, Christopher, PA-C    Family History No family history on file.  Social History Social History   Tobacco Use  . Smoking status: Current Every Day Smoker    Packs/day: 0.50    Types: Cigarettes  Substance Use Topics  . Alcohol use: No  . Drug use: No     Allergies   Divalproex sodium   Review of Systems Review of Systems Ten systems reviewed and are negative for acute change, except as noted in the HPI.    Physical Exam Updated Vital Signs BP 100/69 (BP Location: Right Arm)   Pulse 72   Temp 98.4 F (36.9 C) (Oral)   Resp 18   Ht 5\' 4"  (1.626 m)   Wt 55.2 kg (121 lb 9.6 oz)   LMP 06/20/2017   SpO2 99%   BMI 20.87 kg/m   Physical Exam  Constitutional: She is oriented to person, place, and time. She appears well-developed and well-nourished. No distress.  Nontoxic appearing and in NAD  HENT:  Head: Normocephalic and atraumatic.  Eyes: Conjunctivae and EOM are normal. No scleral icterus.  Neck: Normal range of motion.  Pulmonary/Chest: Effort normal. No respiratory distress.  TTP to the right breast with moderate sized nodule, ~4cm diameter, without overlying erythema, heat to touch. No nipple inversion or drainage. No skin stippling.   Musculoskeletal: Normal range of motion.  Neurological: She is alert and oriented to person, place, and time.  Skin:  Skin is warm and dry. No rash noted. She is not diaphoretic. No erythema. No pallor.  Psychiatric: She has a normal mood and affect. Her behavior is normal.  Nursing note and vitals reviewed.    ED Treatments / Results  Labs (all labs ordered are listed, but only abnormal results are displayed) Labs Reviewed - No data to display  EKG None  Radiology No results found.  Procedures Procedures (including critical care time)  Medications Ordered in ED Medications - No data to display   Initial Impression / Assessment and Plan / ED Course  I have reviewed the triage vital  signs and the nursing notes.  Pertinent labs & imaging results that were available during my care of the patient were reviewed by me and considered in my medical decision making (see chart for details).     32 year old female presents to the emergency department for further evaluation of her right breast nodule.  This does not appear consistent with an abscess or cellulitis.  It has been present for 10 days.  Unclear etiology of symptoms, but will refer to the breast clinic for additional imaging and follow-up.  She has also been given instructions to follow-up with a primary care doctor.  Patient to continue NSAIDs for pain control.  Discussed the use of supportive bras.  Return precautions discussed and provided. Patient discharged in stable condition with no unaddressed concerns.   Final Clinical Impressions(s) / ED Diagnoses   Final diagnoses:  Breast nodule    ED Discharge Orders    None       Antony Madura, PA-C 06/24/17 1610    Pricilla Loveless, MD 06/24/17 [phone]

## 2017-06-27 ENCOUNTER — Emergency Department (HOSPITAL_COMMUNITY): Payer: Medicaid Other

## 2017-06-27 ENCOUNTER — Inpatient Hospital Stay (HOSPITAL_COMMUNITY)
Admission: EM | Admit: 2017-06-27 | Discharge: 2017-06-29 | DRG: 585 | Disposition: A | Discharge status: Home / Self Care | Payer: Medicaid Other | Attending: General Surgery | Admitting: General Surgery

## 2017-06-27 ENCOUNTER — Encounter (HOSPITAL_COMMUNITY): Payer: Self-pay | Admitting: Emergency Medicine

## 2017-06-27 DIAGNOSIS — Z9851 Tubal ligation status: Secondary | ICD-10-CM

## 2017-06-27 DIAGNOSIS — Z888 Allergy status to other drugs, medicaments and biological substances status: Secondary | ICD-10-CM

## 2017-06-27 DIAGNOSIS — N611 Abscess of the breast and nipple: Principal | ICD-10-CM | POA: Diagnosis present

## 2017-06-27 DIAGNOSIS — Z98818 Other dental procedure status: Secondary | ICD-10-CM

## 2017-06-27 DIAGNOSIS — F17210 Nicotine dependence, cigarettes, uncomplicated: Secondary | ICD-10-CM | POA: Diagnosis present

## 2017-06-27 LAB — BASIC METABOLIC PANEL
Anion gap: 11 (ref 5–15)
BUN: <5 mg/dL — ABNORMAL LOW (ref 6–20)
CHLORIDE: 103 mmol/L (ref 101–111)
CO2: 24 mmol/L (ref 22–32)
Calcium: 8.7 mg/dL — ABNORMAL LOW (ref 8.9–10.3)
Creatinine, Ser: 0.55 mg/dL (ref 0.44–1.00)
Glucose, Bld: 117 mg/dL — ABNORMAL HIGH (ref 65–99)
POTASSIUM: 3.4 mmol/L — AB (ref 3.5–5.1)
Sodium: 138 mmol/L (ref 135–145)

## 2017-06-27 LAB — CBC WITH DIFFERENTIAL/PLATELET
BASOS ABS: 0 10*3/uL (ref 0.0–0.1)
BASOS PCT: 0 %
EOS ABS: 0.1 10*3/uL (ref 0.0–0.7)
Eosinophils Relative: 1 %
HCT: 38.2 % (ref 36.0–46.0)
HEMOGLOBIN: 12.8 g/dL (ref 12.0–15.0)
Lymphocytes Relative: 19 %
Lymphs Abs: 2.9 10*3/uL (ref 0.7–4.0)
MCH: 32.6 pg (ref 26.0–34.0)
MCHC: 33.5 g/dL (ref 30.0–36.0)
MCV: 97.2 fL (ref 78.0–100.0)
Monocytes Absolute: 1.3 10*3/uL — ABNORMAL HIGH (ref 0.1–1.0)
Monocytes Relative: 9 %
NEUTROS PCT: 71 %
Neutro Abs: 10.5 10*3/uL — ABNORMAL HIGH (ref 1.7–7.7)
Platelets: 247 10*3/uL (ref 150–400)
RBC: 3.93 MIL/uL (ref 3.87–5.11)
RDW: 13.3 % (ref 11.5–15.5)
WBC: 14.8 10*3/uL — AB (ref 4.0–10.5)

## 2017-06-27 LAB — I-STAT BETA HCG BLOOD, ED (MC, WL, AP ONLY)

## 2017-06-27 MED ORDER — OXYCODONE-ACETAMINOPHEN 5-325 MG PO TABS
1.0000 | ORAL_TABLET | Freq: Once | ORAL | Status: AC
  Administered 2017-06-27: 1 via ORAL
  Filled 2017-06-27: qty 1

## 2017-06-27 NOTE — ED Triage Notes (Signed)
Patiet presents to ED for assessment of right breast lump, hard to palpation, red, incredibly tender to patient.

## 2017-06-27 NOTE — ED Provider Notes (Signed)
[facility] EMERGENCY DEPARTMENT Provider Note   CSN: [id number] Arrival date & time: 06/27/17  1738     History   Chief Complaint Chief Complaint  Patient presents with  . Breast Mass    HPI Sherri Oliver is a 32 y.o. female.  HPI Sherri Oliver is a 32 y.o. female with complaint of right breast pain.  Patient noted a mass in her right breast that was tender about a week ago.  She was seen here in emergency department and referred to breast center.  She states that she had to go to a primary care doctor in order to be seen by breast center, states she does not have a primary care doctor.  She called wellness center and they told her to call back this upcoming Monday.  She states meanwhile the breast mass has gotten much bigger.  Now she states entire breast is red and swollen.  Denies any fever or chills.  Denies any injury to the breast.  No nipple drainage.  Denies being pregnant.  No other complaints.  No history of the same.  History reviewed. No pertinent past medical history.  There are no active problems to display for this patient.   Past Surgical History:  Procedure Laterality Date  . TUBAL LIGATION    . WISDOM TOOTH EXTRACTION    . WISDOM TOOTH EXTRACTION       OB History   None      Home Medications    Prior to Admission medications   Medication Sig Start Date End Date Taking? Authorizing Provider  acetaminophen (TYLENOL) 500 MG tablet Take 1,000 mg by mouth every 6 (six) hours as needed for mild pain.    [provider]  amoxicillin (AMOXIL) 500 MG capsule Take 1 capsule (500 mg total) by mouth 3 (three) times daily. Patient not taking: Reported on 06/23/2017 04/25/12   Palumbo, April, MD  Aspirin-Acetaminophen-Caffeine (EXCEDRIN PO) Take 1 tablet by mouth every 8 (eight) hours as needed (for pain).    [provider]  HYDROcodone-acetaminophen (NORCO/VICODIN) 5-325 MG per tablet Take 1 tablet by mouth every 4  (four) hours as needed for pain. Patient not taking: Reported on 06/23/2017 04/25/12   Palumbo, April, MD  nitrofurantoin, macrocrystal-monohydrate, (MACROBID) 100 MG capsule Take 1 capsule (100 mg total) by mouth 2 (two) times daily. Patient not taking: Reported on 06/23/2017 11/05/15   Charlestine Night, PA-C    Family History History reviewed. No pertinent family history.  Social History Social History   Tobacco Use  . Smoking status: Current Every Day Smoker    Packs/day: 0.50    Types: Cigarettes  . Smokeless tobacco: Never Used  Substance Use Topics  . Alcohol use: No  . Drug use: No     Allergies   Divalproex sodium   Review of Systems Review of Systems  Constitutional: Negative for chills and fever.  Respiratory: Negative for cough, chest tightness and shortness of breath.        Positive for breast tenderness, swelling, pain  Cardiovascular: Negative for chest pain, palpitations and leg swelling.  Musculoskeletal: Negative for arthralgias, myalgias, neck pain and neck stiffness.  Skin: Positive for color change. Negative for rash.  Neurological: Negative for dizziness, weakness and headaches.  All other systems reviewed and are negative.    Physical Exam Updated Vital Signs BP 97/65   Pulse 74   Temp 99.1 F (37.3 C) (Oral)   Resp 15   LMP 06/20/2017  SpO2 99%   Physical Exam  Constitutional: She appears well-developed and well-nourished. No distress.  HENT:  Head: Normocephalic.  Eyes: Conjunctivae are normal.  Neck: Neck supple.  Cardiovascular: Normal rate, regular rhythm and normal heart sounds.  Pulmonary/Chest: Effort normal and breath sounds normal. No respiratory distress. She has no wheezes. She has no rales.  Right breast is erythematous, swollen, firm, tender diffusely.  Most of the firmness and tenderness is just behind the nipple, however it is spread diffusely over the breast.  No nipple drainage.  Musculoskeletal: She exhibits no  edema.  Neurological: She is alert.  Skin: Skin is warm and dry.  Psychiatric: She has a normal mood and affect. Her behavior is normal.  Nursing note and vitals reviewed.    ED Treatments / Results  Labs (all labs ordered are listed, but only abnormal results are displayed) Labs Reviewed  CBC WITH DIFFERENTIAL/PLATELET  BASIC METABOLIC PANEL  I-STAT BETA HCG BLOOD, ED ([facility], [facility], [facility] ONLY)    EKG None  Radiology No results found.  Procedures Procedures (including critical care time)  Medications Ordered in ED Medications  oxyCODONE-acetaminophen (PERCOCET/ROXICET) 5-325 MG per tablet 1 tablet (1 tablet Oral Given 06/27/17 1804)     Initial Impression / Assessment and Plan / ED Course  I have reviewed the triage vital signs and the nursing notes.  Pertinent labs & imaging results that were available during my care of the patient were reviewed by me and considered in my medical decision making (see chart for details).     Patient with right breast swelling, redness, tenderness, induration, concerning for deep tissue abscess.  Will get ultrasound of the breast, and labs.  She is afebrile, otherwise nontoxic appearing.  I will get basic labs and pregnancy test.   10:01 PM Spoke with ultrasound technician, Claris CheMargaret, she asked for me to call radiology and clear her to do this ultrasound.  I spoke with Dr.Chan who cleared the ultrasound.  Pt signed out to [facility] [place] pending US results. Most likely will show abscess which would require surgical consultation and drainage.      Final Clinical Impressions(s) / ED Diagnoses   Final diagnoses:  Breast abscess    ED Discharge Orders    None       Jaynie CrumbleKirichenko, Tillie Viverette, PA-C 06/28/17 1210    Tegeler, Canary Brimhristopher J, MD 06/29/17 [phone]

## 2017-06-27 NOTE — ED Notes (Signed)
Patient transported to Ultrasound 

## 2017-06-27 NOTE — H&P (Signed)
Sherri Oliver is an 32 y.o. female.   Chief Complaint: Right breast pain HPI: This is a 32 year old female who I have been asked to admit for a right breast abscess.  She actually presented here to the emergency department on 4/16.  She was referred to the breast center but when she called the next day she reports they did not receive a referral.  She is now back with worsening breast pain.  An ultrasound was performed showing a large almost 5 cm subareolar breast abscess.  It is apparently complex.  She has no previous history of breast abscesses.  She denies nipple discharge.  She is otherwise without complaints.  History reviewed. No pertinent past medical history.  Past Surgical History:  Procedure Laterality Date  . TUBAL LIGATION    . WISDOM TOOTH EXTRACTION    . WISDOM TOOTH EXTRACTION      History reviewed. No pertinent family history. Social History:  reports that she has been smoking cigarettes.  She has been smoking about 0.50 packs per day. She has never used smokeless tobacco. She reports that she does not drink alcohol or use drugs.  Allergies:  Allergies  Allergen Reactions  . Divalproex Sodium Swelling    Throat swelling     (Not in a hospital admission)  Results for orders placed or performed during the hospital encounter of 06/27/17 (from the past 48 hour(s))  CBC with Differential     Status: Abnormal   Collection Time: 06/27/17  9:25 PM  Result Value Ref Range   WBC 14.8 (H) 4.0 - 10.5 K/uL   RBC 3.93 3.87 - 5.11 MIL/uL   Hemoglobin 12.8 12.0 - 15.0 g/dL   HCT 38.2 36.0 - 46.0 %   MCV 97.2 78.0 - 100.0 fL   MCH 32.6 26.0 - 34.0 pg   MCHC 33.5 30.0 - 36.0 g/dL   RDW 13.3 11.5 - 15.5 %   Platelets 247 150 - 400 K/uL   Neutrophils Relative % 71 %   Neutro Abs 10.5 (H) 1.7 - 7.7 K/uL   Lymphocytes Relative 19 %   Lymphs Abs 2.9 0.7 - 4.0 K/uL   Monocytes Relative 9 %   Monocytes Absolute 1.3 (H) 0.1 - 1.0 K/uL   Eosinophils Relative 1 %   Eosinophils Absolute 0.1 0.0 - 0.7 K/uL   Basophils Relative 0 %   Basophils Absolute 0.0 0.0 - 0.1 K/uL    Comment: Performed at [facility], [address] [place], [place] [address]  Basic metabolic panel     Status: Abnormal   Collection Time: 06/27/17  9:25 PM  Result Value Ref Range   Sodium 138 135 - 145 mmol/L   Potassium 3.4 (L) 3.5 - 5.1 mmol/L   Chloride 103 101 - 111 mmol/L   CO2 24 22 - 32 mmol/L   Glucose, Bld 117 (H) 65 - 99 mg/dL   BUN <5 (L) 6 - 20 mg/dL   Creatinine, Ser 0.55 0.44 - 1.00 mg/dL   Calcium 8.7 (L) 8.9 - 10.3 mg/dL   GFR calc non Af Amer >60 >60 mL/min   GFR calc Af Amer >60 >60 mL/min    Comment: (NOTE) The eGFR has been calculated using the CKD EPI equation. This calculation has not been validated in all clinical situations. eGFR's persistently <60 mL/min signify possible Chronic Kidney Disease.    Anion gap 11 5 - 15    Comment: Performed at [facility]  [address], [place], [place] [address]  I-Stat Beta hCG blood, ED ([facility], [facility], [facility] only)     Status: None   Collection Time: 06/27/17  9:35 PM  Result Value Ref Range   I-stat hCG, quantitative <5.0 <5 mIU/mL   Comment 3            Comment:   GEST. AGE      CONC.  (mIU/mL)   <=1 WEEK        5 - 50     2 WEEKS       50 - 500     3 WEEKS       100 - 10,000     4 WEEKS     1,000 - 30,000        FEMALE AND NON-PREGNANT FEMALE:     LESS THAN 5 mIU/mL    US Breast Ltd Uni Right Inc Axilla  Result Date: 06/27/2017 CLINICAL DATA:  Acute onset of right breast erythema and mass. EXAM: ULTRASOUND OF THE RIGHT BREAST COMPARISON:  None. FINDINGS: On physical exam, swelling and erythema are noted at and about the patient's subareolar region. Targeted ultrasound is performed, showing a complex collection of fluid deep to the nipple, measuring 4.7 x 2.9 x 4.0 cm. Mild surrounding soft tissue edema is noted. No significant hyperemia is seen at this time. IMPRESSION: 4.7 x 2.9 x 4.0 cm abscess  noted deep to the right nipple, with mild surrounding soft tissue edema. BI-RADS CATEGORY  2 These results were called by telephone at the time of interpretation on 06/27/2017 at 10:58 pm to the ER clinical team, who verbally acknowledged these results. Electronically Signed   By: Garald Balding M.D.   On: 06/27/2017 22:59    Review of Systems  Constitutional: Negative for chills and fever.  Respiratory: Negative for shortness of breath.   Cardiovascular: Negative for chest pain.  All other systems reviewed and are negative.   Blood pressure 110/72, pulse 74, temperature 98.4 F (36.9 C), temperature source Oral, resp. rate 16, last menstrual period 06/20/2017, SpO2 98 %. Physical Exam  Constitutional: She is oriented to person, place, and time. She appears well-developed and well-nourished. No distress.  HENT:  Head: Normocephalic and atraumatic.  Right Ear: External ear normal.  Left Ear: External ear normal.  Nose: Nose normal.  Mouth/Throat: Oropharynx is clear and moist.  Eyes: Pupils are equal, round, and reactive to light. Right eye exhibits no discharge. Left eye exhibits no discharge.  Neck: Normal range of motion. No tracheal deviation present.  Cardiovascular: Normal rate, regular rhythm and normal heart sounds.  Respiratory: Effort normal and breath sounds normal. No respiratory distress.  GI: Soft. There is no tenderness.  Musculoskeletal: Normal range of motion. She exhibits no edema, tenderness or deformity.  Neurological: She is alert and oriented to person, place, and time.  Skin: She is not diaphoretic. There is erythema.  Her right breast shows erythema surrounding the areola with firmness and tenderness  Psychiatric: Her behavior is normal. Judgment normal.     Assessment/Plan Right breast abscess  This will now require incision and drainage in the operating room.  I have discussed this with the patient and she is in agreement.  She will be admitted and placed on  IV antibiotics with plans for surgery in the morning.  Harl Bowie, MD 06/27/2017, 11:27 PM

## 2017-06-27 NOTE — ED Provider Notes (Signed)
Care assumed from previous provider PA Kirchenko. Please see their note for further details to include full history and physical. To summarize in short pt is a 32 year old female presents to the ED with breast abscess for the past week. Case discussed, plan agreed upon.  Eating lab work and imaging at care handoff.  Prior provider felt that patient would need admission for IV antibiotics and possible surgical intervention.  Patient has a notable leukocytosis on lab work otherwise lab work is reassuring.  Ultrasound shows a 5 cm right breast abscess.  I spoke with Dr. Rayburn MaBlackmon with general surgery who agrees to admission will see patient in the ED and place admission orders.  Patient updated on plan of care.  She remains hemodynamically stable.       Rise MuLeaphart, Kenneth T, PA-C 06/27/17 2326    Tegeler, Canary Brimhristopher J, MD 06/28/17 0001

## 2017-06-28 ENCOUNTER — Encounter (HOSPITAL_COMMUNITY): Admission: EM | Disposition: A | Discharge status: Home / Self Care | Payer: Self-pay | Source: Home / Self Care

## 2017-06-28 ENCOUNTER — Observation Stay (HOSPITAL_COMMUNITY): Payer: Medicaid Other | Admitting: Certified Registered"

## 2017-06-28 ENCOUNTER — Encounter (HOSPITAL_COMMUNITY): Payer: Self-pay | Admitting: Certified Registered"

## 2017-06-28 DIAGNOSIS — Z98818 Other dental procedure status: Secondary | ICD-10-CM | POA: Diagnosis not present

## 2017-06-28 DIAGNOSIS — N611 Abscess of the breast and nipple: Secondary | ICD-10-CM | POA: Diagnosis present

## 2017-06-28 DIAGNOSIS — Z888 Allergy status to other drugs, medicaments and biological substances status: Secondary | ICD-10-CM | POA: Diagnosis not present

## 2017-06-28 DIAGNOSIS — F17210 Nicotine dependence, cigarettes, uncomplicated: Secondary | ICD-10-CM | POA: Diagnosis present

## 2017-06-28 DIAGNOSIS — Z9851 Tubal ligation status: Secondary | ICD-10-CM | POA: Diagnosis not present

## 2017-06-28 DIAGNOSIS — N644 Mastodynia: Secondary | ICD-10-CM | POA: Diagnosis present

## 2017-06-28 HISTORY — PX: IRRIGATION AND DEBRIDEMENT ABSCESS: SHX5252

## 2017-06-28 LAB — SURGICAL PCR SCREEN
MRSA, PCR: NEGATIVE
STAPHYLOCOCCUS AUREUS: NEGATIVE

## 2017-06-28 LAB — HIV ANTIBODY (ROUTINE TESTING W REFLEX): HIV Screen 4th Generation wRfx: NONREACTIVE

## 2017-06-28 SURGERY — IRRIGATION AND DEBRIDEMENT ABSCESS
Anesthesia: General | Site: Breast | Laterality: Right

## 2017-06-28 MED ORDER — POTASSIUM CHLORIDE IN NACL 20-0.9 MEQ/L-% IV SOLN
INTRAVENOUS | Status: DC
  Administered 2017-06-28 (×3): via INTRAVENOUS
  Filled 2017-06-28 (×3): qty 1000

## 2017-06-28 MED ORDER — OXYCODONE HCL 5 MG PO TABS: 5.0000 mg | ORAL_TABLET | Freq: Once | ORAL | Status: DC | PRN

## 2017-06-28 MED ORDER — LIDOCAINE 2% (20 MG/ML) 5 ML SYRINGE
INTRAMUSCULAR | Status: DC | PRN
  Administered 2017-06-28: 30 mg via INTRAVENOUS

## 2017-06-28 MED ORDER — DEXAMETHASONE SODIUM PHOSPHATE 10 MG/ML IJ SOLN
INTRAMUSCULAR | Status: AC
  Filled 2017-06-28: qty 1

## 2017-06-28 MED ORDER — MIDAZOLAM HCL 5 MG/5ML IJ SOLN
INTRAMUSCULAR | Status: DC | PRN
  Administered 2017-06-28: 2 mg via INTRAVENOUS

## 2017-06-28 MED ORDER — FENTANYL CITRATE (PF) 250 MCG/5ML IJ SOLN
INTRAMUSCULAR | Status: AC
  Filled 2017-06-28: qty 5

## 2017-06-28 MED ORDER — PROPOFOL 10 MG/ML IV BOLUS
INTRAVENOUS | Status: AC
  Filled 2017-06-28: qty 20

## 2017-06-28 MED ORDER — ONDANSETRON 4 MG PO TBDP
4.0000 mg | ORAL_TABLET | Freq: Four times a day (QID) | ORAL | Status: DC | PRN
  Filled 2017-06-28: qty 1

## 2017-06-28 MED ORDER — MIDAZOLAM HCL 2 MG/2ML IJ SOLN
INTRAMUSCULAR | Status: AC
  Filled 2017-06-28: qty 2

## 2017-06-28 MED ORDER — OXYCODONE HCL 5 MG PO TABS
5.0000 mg | ORAL_TABLET | Freq: Four times a day (QID) | ORAL | Status: DC | PRN
  Administered 2017-06-28 – 2017-06-29 (×2): 5 mg via ORAL
  Filled 2017-06-28 (×2): qty 1

## 2017-06-28 MED ORDER — ONDANSETRON HCL 4 MG/2ML IJ SOLN: 4.0000 mg | Freq: Once | INTRAMUSCULAR | Status: DC | PRN

## 2017-06-28 MED ORDER — PROPOFOL 10 MG/ML IV BOLUS
INTRAVENOUS | Status: DC | PRN
  Administered 2017-06-28: 200 mg via INTRAVENOUS

## 2017-06-28 MED ORDER — SODIUM CHLORIDE 0.9 % IV BOLUS
1000.0000 mL | Freq: Once | INTRAVENOUS | Status: AC
  Administered 2017-06-28: 1000 mL via INTRAVENOUS

## 2017-06-28 MED ORDER — ENOXAPARIN SODIUM 40 MG/0.4ML ~~LOC~~ SOLN
40.0000 mg | SUBCUTANEOUS | Status: DC
  Administered 2017-06-28: 40 mg via SUBCUTANEOUS
  Filled 2017-06-28: qty 0.4

## 2017-06-28 MED ORDER — ONDANSETRON HCL 4 MG/2ML IJ SOLN
INTRAMUSCULAR | Status: DC | PRN
  Administered 2017-06-28: 4 mg via INTRAVENOUS

## 2017-06-28 MED ORDER — MORPHINE SULFATE (PF) 4 MG/ML IV SOLN
1.0000 mg | INTRAVENOUS | Status: DC | PRN
  Administered 2017-06-28: 4 mg via INTRAVENOUS
  Administered 2017-06-28: 2 mg via INTRAVENOUS
  Administered 2017-06-28: 4 mg via INTRAVENOUS
  Filled 2017-06-28 (×3): qty 1

## 2017-06-28 MED ORDER — DIPHENHYDRAMINE HCL 50 MG/ML IJ SOLN: 25.0000 mg | Freq: Four times a day (QID) | INTRAMUSCULAR | Status: DC | PRN

## 2017-06-28 MED ORDER — LACTATED RINGERS IV SOLN
INTRAVENOUS | Status: DC | PRN
  Administered 2017-06-28: 10:00:00 via INTRAVENOUS

## 2017-06-28 MED ORDER — OXYCODONE HCL 5 MG/5ML PO SOLN: 5.0000 mg | Freq: Once | ORAL | Status: DC | PRN

## 2017-06-28 MED ORDER — ONDANSETRON HCL 4 MG/2ML IJ SOLN
INTRAMUSCULAR | Status: AC
  Filled 2017-06-28: qty 2

## 2017-06-28 MED ORDER — FENTANYL CITRATE (PF) 250 MCG/5ML IJ SOLN
INTRAMUSCULAR | Status: DC | PRN
  Administered 2017-06-28 (×2): 25 ug via INTRAVENOUS
  Administered 2017-06-28: 50 ug via INTRAVENOUS

## 2017-06-28 MED ORDER — ONDANSETRON HCL 4 MG/2ML IJ SOLN: 4.0000 mg | Freq: Four times a day (QID) | INTRAMUSCULAR | Status: DC | PRN

## 2017-06-28 MED ORDER — DIPHENHYDRAMINE HCL 25 MG PO CAPS
25.0000 mg | ORAL_CAPSULE | Freq: Four times a day (QID) | ORAL | Status: DC | PRN
Start: 2017-06-28 — End: 2017-06-29

## 2017-06-28 MED ORDER — LIDOCAINE 2% (20 MG/ML) 5 ML SYRINGE
INTRAMUSCULAR | Status: AC
  Filled 2017-06-28: qty 5

## 2017-06-28 MED ORDER — NICOTINE 14 MG/24HR TD PT24
14.0000 mg | MEDICATED_PATCH | Freq: Every day | TRANSDERMAL | Status: DC
  Administered 2017-06-28 – 2017-06-29 (×2): 14 mg via TRANSDERMAL
  Filled 2017-06-28 (×2): qty 1

## 2017-06-28 MED ORDER — DEXAMETHASONE SODIUM PHOSPHATE 10 MG/ML IJ SOLN
INTRAMUSCULAR | Status: DC | PRN
  Administered 2017-06-28: 5 mg via INTRAVENOUS

## 2017-06-28 MED ORDER — PIPERACILLIN-TAZOBACTAM 3.375 G IVPB
3.3750 g | Freq: Three times a day (TID) | INTRAVENOUS | Status: DC
  Administered 2017-06-28 – 2017-06-29 (×5): 3.375 g via INTRAVENOUS
  Filled 2017-06-28 (×6): qty 50

## 2017-06-28 MED ORDER — FENTANYL CITRATE (PF) 100 MCG/2ML IJ SOLN
25.0000 ug | INTRAMUSCULAR | Status: DC | PRN
  Administered 2017-06-28: 50 ug via INTRAVENOUS

## 2017-06-28 MED ORDER — IBUPROFEN 800 MG PO TABS
800.0000 mg | ORAL_TABLET | Freq: Three times a day (TID) | ORAL | Status: DC | PRN
  Administered 2017-06-29: 800 mg via ORAL
  Filled 2017-06-28: qty 4
  Filled 2017-06-28 (×2): qty 1

## 2017-06-28 MED ORDER — 0.9 % SODIUM CHLORIDE (POUR BTL) OPTIME
TOPICAL | Status: DC | PRN
  Administered 2017-06-28: 1000 mL

## 2017-06-28 MED ORDER — FENTANYL CITRATE (PF) 100 MCG/2ML IJ SOLN
INTRAMUSCULAR | Status: AC
  Administered 2017-06-28: 50 ug via INTRAVENOUS
  Filled 2017-06-28: qty 2

## 2017-06-28 SURGICAL SUPPLY — 36 items
BLADE CLIPPER SURG (BLADE) IMPLANT
BNDG GAUZE ELAST 4 BULKY (GAUZE/BANDAGES/DRESSINGS) IMPLANT
CANISTER SUCT 3000ML PPV (MISCELLANEOUS) ×3 IMPLANT
COVER SURGICAL LIGHT HANDLE (MISCELLANEOUS) ×3 IMPLANT
DRAIN PENROSE 1/4X12 LTX STRL (WOUND CARE) ×3 IMPLANT
DRAPE LAPAROSCOPIC ABDOMINAL (DRAPES) IMPLANT
DRAPE LAPAROTOMY 100X72 PEDS (DRAPES) IMPLANT
DRSG PAD ABDOMINAL 8X10 ST (GAUZE/BANDAGES/DRESSINGS) IMPLANT
ELECT CAUTERY BLADE 6.4 (BLADE) ×3 IMPLANT
ELECT REM PT RETURN 9FT ADLT (ELECTROSURGICAL) ×3
ELECTRODE REM PT RTRN 9FT ADLT (ELECTROSURGICAL) ×1 IMPLANT
GAUZE IODOFORM PACK 1/2 7832 (GAUZE/BANDAGES/DRESSINGS) ×3 IMPLANT
GAUZE PACKING IODOFORM 1/2 (PACKING) ×3 IMPLANT
GAUZE SPONGE 4X4 12PLY STRL (GAUZE/BANDAGES/DRESSINGS) IMPLANT
GAUZE SPONGE 4X4 12PLY STRL LF (GAUZE/BANDAGES/DRESSINGS) ×3 IMPLANT
GLOVE BIOGEL PI IND STRL 7.5 (GLOVE) ×1 IMPLANT
GLOVE BIOGEL PI INDICATOR 7.5 (GLOVE) ×2
GLOVE SURG SS PI 7.0 STRL IVOR (GLOVE) ×3 IMPLANT
GOWN STRL REUS W/ TWL LRG LVL3 (GOWN DISPOSABLE) ×2 IMPLANT
GOWN STRL REUS W/TWL LRG LVL3 (GOWN DISPOSABLE) ×4
KIT BASIN OR (CUSTOM PROCEDURE TRAY) ×3 IMPLANT
KIT TURNOVER KIT B (KITS) ×3 IMPLANT
NEEDLE 22X1 1/2 (OR ONLY) (NEEDLE) ×3 IMPLANT
NS IRRIG 1000ML POUR BTL (IV SOLUTION) ×3 IMPLANT
PACK SURGICAL SETUP 50X90 (CUSTOM PROCEDURE TRAY) ×3 IMPLANT
PAD ABD 8X10 STRL (GAUZE/BANDAGES/DRESSINGS) ×3 IMPLANT
PAD ARMBOARD 7.5X6 YLW CONV (MISCELLANEOUS) ×3 IMPLANT
PENCIL BUTTON HOLSTER BLD 10FT (ELECTRODE) ×3 IMPLANT
SWAB COLLECTION DEVICE MRSA (MISCELLANEOUS) IMPLANT
SWAB CULTURE ESWAB REG 1ML (MISCELLANEOUS) IMPLANT
TAPE CLOTH SURG 6X10 WHT LF (GAUZE/BANDAGES/DRESSINGS) ×3 IMPLANT
TOWEL OR 17X24 6PK STRL BLUE (TOWEL DISPOSABLE) ×3 IMPLANT
TOWEL OR 17X26 10 PK STRL BLUE (TOWEL DISPOSABLE) ×3 IMPLANT
TUBE CONNECTING 12'X1/4 (SUCTIONS) ×1
TUBE CONNECTING 12X1/4 (SUCTIONS) ×2 IMPLANT
YANKAUER SUCT BULB TIP NO VENT (SUCTIONS) ×3 IMPLANT

## 2017-06-28 NOTE — Progress Notes (Signed)
Pre Procedure note for inpatients:   Sherri Oliver has been scheduled for Procedure(s): IRRIGATION AND DEBRIDEMENT RIGHT BREAST (Right) today. The various methods of treatment have been discussed with the patient. After consideration of the risks, benefits and treatment options the patient has consented to the planned procedure.   The patient has been seen and labs reviewed. There are no changes in the patient's condition to prevent proceeding with the planned procedure today.  Recent labs:  Lab Results  Component Value Date   WBC 14.8 (H) 06/27/2017   HGB 12.8 06/27/2017   HCT 38.2 06/27/2017   PLT 247 06/27/2017   GLUCOSE 117 (H) 06/27/2017   NA 138 06/27/2017   K 3.4 (L) 06/27/2017   CL 103 06/27/2017   CREATININE 0.55 06/27/2017   BUN <5 (L) 06/27/2017   CO2 24 06/27/2017    Rodman PickleLuke Aaron Kinsinger, MD 06/28/2017 9:27 AM

## 2017-06-28 NOTE — Progress Notes (Signed)
Pt BP this morning was 84/55, rechecked and it was 86/50 with HR 69. Pt asymptomatic. Notified Dr. Magnus IvanBlackman and verbalized order to give 1L NS bolus over 1 hour. Nursing will continue to monitor patient.

## 2017-06-28 NOTE — Transfer of Care (Signed)
Immediate Anesthesia Transfer of Care Note  Patient: Sherri Oliver  Procedure(s) Performed: IRRIGATION AND DEBRIDEMENT RIGHT BREAST ABCESS  (Right Breast)  Patient Location: PACU  Anesthesia Type:General  Level of Consciousness: awake, alert , oriented and patient cooperative  Airway & Oxygen Therapy: Patient Spontanous Breathing and Patient connected to nasal cannula oxygen  Post-op Assessment: Report given to RN, Post -op Vital signs reviewed and stable and Patient moving all extremities  Post vital signs: Reviewed and stable  Last Vitals:  Vitals Value Taken Time  BP 79/50 06/28/2017 10:28 AM  Temp    Pulse 91 06/28/2017 10:29 AM  Resp 15 06/28/2017 10:29 AM  SpO2 97 % 06/28/2017 10:29 AM  Vitals shown include unvalidated device data.  Last Pain:  Vitals:   06/28/17 0618  TempSrc:   PainSc: Asleep         Complications: No apparent anesthesia complications

## 2017-06-28 NOTE — Anesthesia Procedure Notes (Signed)
Procedure Name: LMA Insertion Date/Time: 06/28/2017 9:46 AM Performed by: Lucinda Dellecarlo, July Nickson M, CRNA Pre-anesthesia Checklist: Emergency Drugs available, Patient identified, Suction available and Patient being monitored Patient Re-evaluated:Patient Re-evaluated prior to induction Oxygen Delivery Method: Circle system utilized Preoxygenation: Pre-oxygenation with 100% oxygen Induction Type: IV induction Ventilation: Mask ventilation without difficulty LMA: LMA inserted LMA Size: 4.0 Number of attempts: 1 Placement Confirmation: positive ETCO2 and breath sounds checked- equal and bilateral Tube secured with: Tape Dental Injury: Teeth and Oropharynx as per pre-operative assessment

## 2017-06-28 NOTE — Anesthesia Preprocedure Evaluation (Signed)
Anesthesia Evaluation  Patient identified by MRN, date of birth, ID band Patient awake    Reviewed: Allergy & Precautions, NPO status , Patient's Chart, lab work & pertinent test results  Airway Mallampati: II  TM Distance: >3 FB     Dental  (+) Teeth Intact, Dental Advisory Given   Pulmonary Current Smoker,    breath sounds clear to auscultation       Cardiovascular  Rhythm:Regular Rate:Normal     Neuro/Psych    GI/Hepatic   Endo/Other    Renal/GU      Musculoskeletal   Abdominal   Peds  Hematology   Anesthesia Other Findings   Reproductive/Obstetrics                             Anesthesia Physical Anesthesia Plan  ASA: II  Anesthesia Plan: General   Post-op Pain Management:    Induction: Intravenous  PONV Risk Score and Plan: Ondansetron and Dexamethasone  Airway Management Planned: LMA  Additional Equipment:   Intra-op Plan:   Post-operative Plan:   Informed Consent: I have reviewed the patients History and Physical, chart, labs and discussed the procedure including the risks, benefits and alternatives for the proposed anesthesia with the patient or authorized representative who has indicated his/her understanding and acceptance.   Dental advisory given  Plan Discussed with: CRNA and Anesthesiologist  Anesthesia Plan Comments:         Anesthesia Quick Evaluation

## 2017-06-28 NOTE — Anesthesia Postprocedure Evaluation (Signed)
Anesthesia Post Note  Patient: Heron NayJessica L Traum  Procedure(s) Performed: IRRIGATION AND DEBRIDEMENT RIGHT BREAST ABCESS  (Right Breast)     Patient location during evaluation: PACU Anesthesia Type: General Level of consciousness: awake and alert, awake and oriented Pain management: pain level controlled Vital Signs Assessment: post-procedure vital signs reviewed and stable Respiratory status: spontaneous breathing, nonlabored ventilation and respiratory function stable Cardiovascular status: blood pressure returned to baseline Anesthetic complications: no    Last Vitals:  Vitals:   06/28/17 1159 06/28/17 1527  BP: (!) 92/56 (!) 97/55  Pulse: 77 66  Resp: 18 16  Temp: 37.1 C 36.9 C  SpO2: 96% 97%    Last Pain:  Vitals:   06/28/17 1527  TempSrc: Oral  PainSc:                  Mahiya Kercheval COKER

## 2017-06-28 NOTE — Op Note (Signed)
Preoperative diagnosis: right breast abscess  Postoperative diagnosis: same   Procedure: incision and drainage of right breast abscess  Surgeon: Feliciana RossettiLuke Kinsinger, M.D.  Asst: none  Anesthesia: general  Indications for procedure: Sherri NayJessica L Oliver is a 32 y.o. year old female with symptoms of swelling and right breast pain.  Description of procedure: The patient was brought into the operative suite. Anesthesia was administered with General LMA anesthesia. WHO checklist was applied. The patient was then placed in supine position. The area was prepped and draped in the usual sterile fashion.  Next, a periareolar incision was made at 2 o clock. A large amount of green purulence was expressed and collected for culture. The wound was probed further and appeared to be completely subareolar. A counter incision was made at 9 o clock. A 1/4" penrose was introduced and brought through the two incisions and sutured in place with a 0 silk. The wound was irrigated to ensure complete drainage. A 1/2" iodoform was packed into the wound. Gauze and tape was put in place. The patient awoke from anesthesia and was brought to PACU in stable condition  Findings: large abscess under right areola  Specimen: abscess right breast  Implant: 1/4" penrose drain   Blood loss: <8520ml  Local anesthesia: none  Complications: none  Feliciana RossettiLuke Kinsinger, M.D. General, Bariatric, & Minimally Invasive Surgery [facility]

## 2017-06-29 ENCOUNTER — Other Ambulatory Visit: Payer: Self-pay

## 2017-06-29 ENCOUNTER — Encounter (HOSPITAL_COMMUNITY): Payer: Self-pay | Admitting: General Surgery

## 2017-06-29 MED ORDER — IBUPROFEN 800 MG PO TABS: 800.0000 mg | ORAL_TABLET | Freq: Three times a day (TID) | ORAL | 0 refills | Status: AC | PRN

## 2017-06-29 MED ORDER — OXYCODONE HCL 5 MG PO TABS: 5.0000 mg | ORAL_TABLET | Freq: Four times a day (QID) | ORAL | 0 refills | Status: AC | PRN

## 2017-06-29 MED ORDER — SULFAMETHOXAZOLE-TRIMETHOPRIM 800-160 MG PO TABS: 1.0000 | ORAL_TABLET | Freq: Two times a day (BID) | ORAL | 0 refills | Status: AC

## 2017-06-29 NOTE — Discharge Summary (Signed)
Physician Discharge Summary  Patient ID: Sherri Oliver MRN: [id number] DOB/AGE: [birth year] 31 y.o.  Admit date: 06/27/2017 Discharge date: 06/29/2017  Admission Diagnoses:  Discharge Diagnoses:  Active Problems:   Abscess of right breast   Discharged Condition: good  Hospital Course: underwent I&D right breast abscess 4/21. Subsequently pain and erythema did improve.   Consults: None  Significant Diagnostic Studies: see EPIC  Treatments: surgery: as above  Discharge Exam: Blood pressure 101/60, pulse (!) 56, temperature 97.8 F (36.6 C), temperature source Oral, resp. rate 16, height 5\' 4"  (1.626 m), weight 55.3 kg (122 lb), last menstrual period 06/20/2017, SpO2 97 %. General appearance: alert and cooperative Resp: clear to auscultation bilaterally Breasts: normal appearance, no masses or tenderness, Penrose through retroareolar abscess with minimal faint residual erythema. Packing removed.  GI: soft, non-tender; bowel sounds normal; no masses,  no organomegaly  Disposition: Discharge disposition: 01-Home or Self Care       Discharge Instructions    Increase activity slowly   Complete by:  As directed      Allergies as of 06/29/2017      Reactions   Divalproex Sodium Swelling   Throat swelling      Medication List    TAKE these medications   acetaminophen 500 MG tablet Commonly known as:  TYLENOL Take 1,000 mg by mouth every 6 (six) hours as needed for mild pain.   EXCEDRIN PO Take 1 tablet by mouth every 8 (eight) hours as needed (for pain).   ibuprofen 800 MG tablet Commonly known as:  ADVIL,MOTRIN Take 1 tablet (800 mg total) by mouth every 8 (eight) hours as needed for moderate pain.   oxyCODONE 5 MG immediate release tablet Commonly known as:  Oxy IR/ROXICODONE Take 1 tablet (5 mg total) by mouth every 6 (six) hours as needed for moderate pain.   sulfamethoxazole-trimethoprim 800-160 MG tablet Commonly known as:  BACTRIM DS,SEPTRA DS Take  1 tablet by mouth 2 (two) times daily.      Follow-up Information    [facility] 2 week(s).   Specialty:  General Surgery Contact information: [address] [place] [address] [phone]           Signed: Berna BueChelsea A Joevon Holliman 06/29/2017, 8:04 AM

## 2017-06-29 NOTE — Discharge Instructions (Signed)
WOUND CARE  It is important that the wound be kept open.   -Keeping the skin edges apart will allow the wound to gradually heal from the base upwards.   - If the skin edges of the wound close too early, a new fluid pocket can form and infection can occur. -This is the reason to pack deeper wounds with gauze or ribbon -This is why drained wounds cannot be sewed closed right away  A healthy wound should form a lining of bright red "beefy" granulating tissue that will help shrink the wound and help the edges grow new skin into it.   -A little mucus / yellow discharge is normal (the body's natural way to try and form a scab) and should be gently washed off with soap and water with daily dressing changes.  -Green or foul smelling drainage implies bacterial colonization and can slow wound healing - a short course of antibiotic ointment (3-5 days) can help it clear up.  Call the doctor if it does not improve or worsens  -Avoid use of antibiotic ointments for more than a week as they can slow wound healing over time.    -Sometimes other wound care products will be used to reduce need for dressing changes and/or help clean up dirty wounds -Sometimes the surgeon needs to debride the wound in the office to remove dead or infected tissue out of the wound so it can heal more quickly and safely.    Change the dressing at least once a day -Wash the wound with mild soap and water gently every day.  It is good to shower or bathe the wound to help it clean out. -Keep the skin dry around the wound to prevent breakdown and irritation.   -Cover with a clean gauze and tape -paper or Medipore tape tend to be gentle on the skin -rotate the orientation of the tape to avoid repeated stress/trauma on the skin   Complete all antibiotics through the entire prescription to help the infection heal and prevent new places of infection   Returning the see the surgeon is helpful to follow the healing process and help the  wound close as fast as possible.  Drain will be removed in the office at your follow up visit.

## 2017-06-29 NOTE — Plan of Care (Signed)
  Problem: Health Behavior/Discharge Planning: Goal: Ability to manage health-related needs will improve Outcome: Adequate for Discharge   Problem: Clinical Measurements: Goal: Ability to maintain clinical measurements within normal limits will improve Outcome: Adequate for Discharge Goal: Will remain free from infection Outcome: Adequate for Discharge Goal: Diagnostic test results will improve Outcome: Adequate for Discharge Goal: Respiratory complications will improve Outcome: Adequate for Discharge Goal: Cardiovascular complication will be avoided Outcome: Adequate for Discharge   Problem: Nutrition: Goal: Adequate nutrition will be maintained Outcome: Adequate for Discharge   Problem: Elimination: Goal: Will not experience complications related to bowel motility Outcome: Adequate for Discharge Goal: Will not experience complications related to urinary retention Outcome: Adequate for Discharge   Problem: Skin Integrity: Goal: Risk for impaired skin integrity will decrease Outcome: Adequate for Discharge

## 2017-07-03 LAB — AEROBIC/ANAEROBIC CULTURE (SURGICAL/DEEP WOUND)

## 2017-07-03 LAB — AEROBIC/ANAEROBIC CULTURE W GRAM STAIN (SURGICAL/DEEP WOUND)

## 2019-10-06 IMAGING — US US BREAST*R* LIMITED INC AXILLA
1 series · 9 of 9 positions shown · non-contrast
Comparison: None.

CLINICAL DATA: Acute onset of right breast erythema and mass.

EXAM:
ULTRASOUND OF THE RIGHT BREAST

[Series 1: us breast*right* limited inc axilla · 0.07mm/px · 9 acquisitions, 9 frames shown]
[im 1/9]
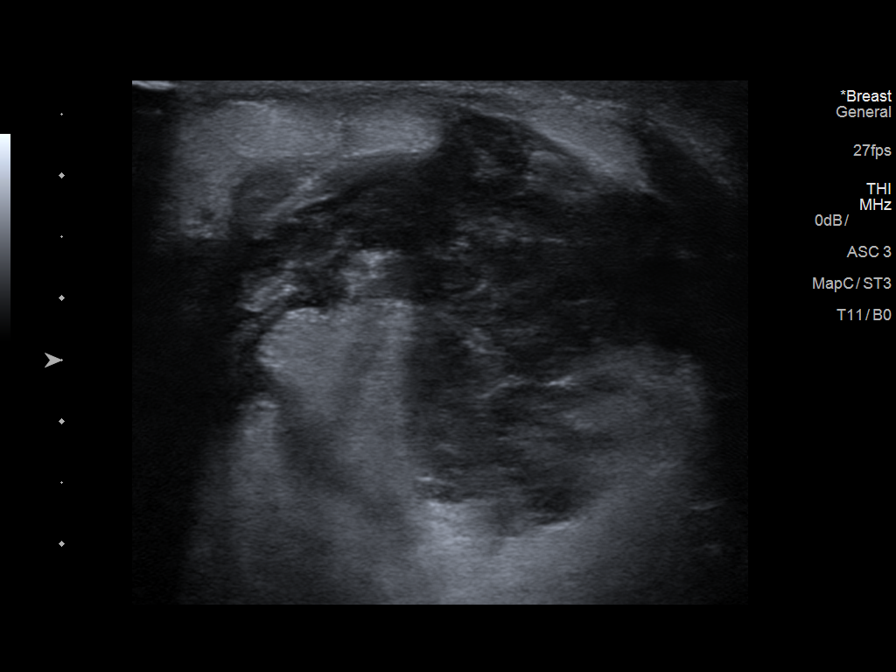
[im 2/9]
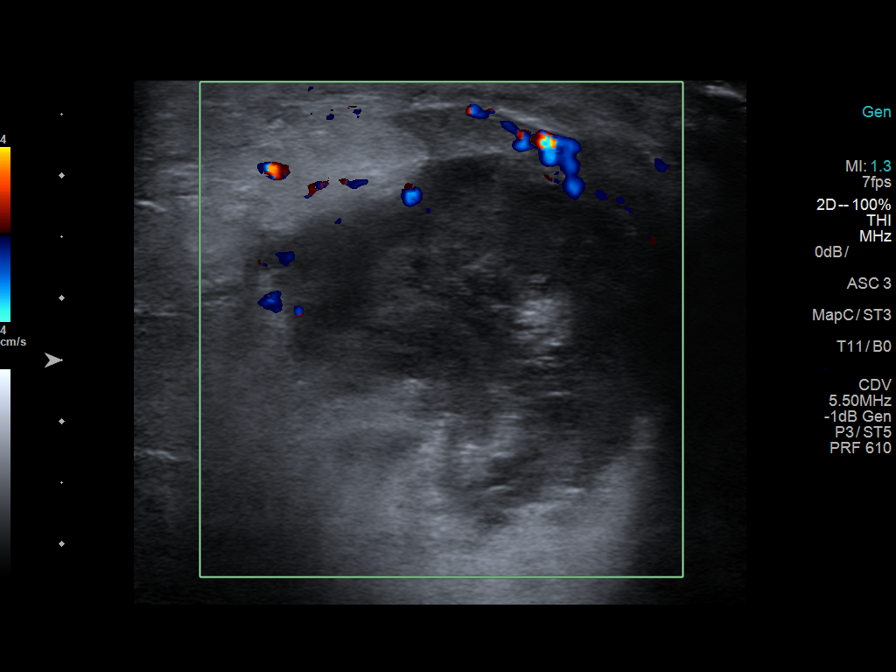
[im 3/9]
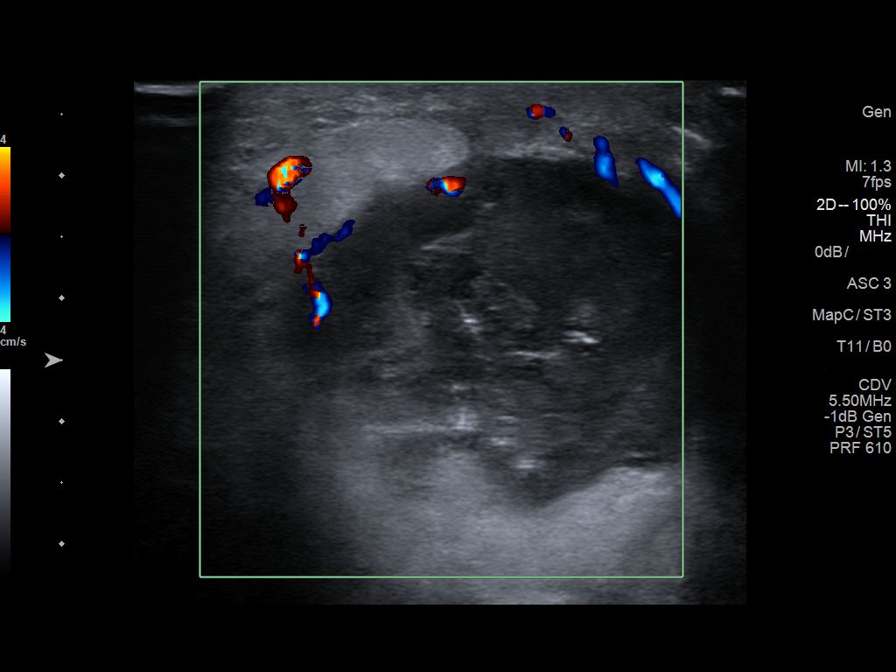
[im 4/9]
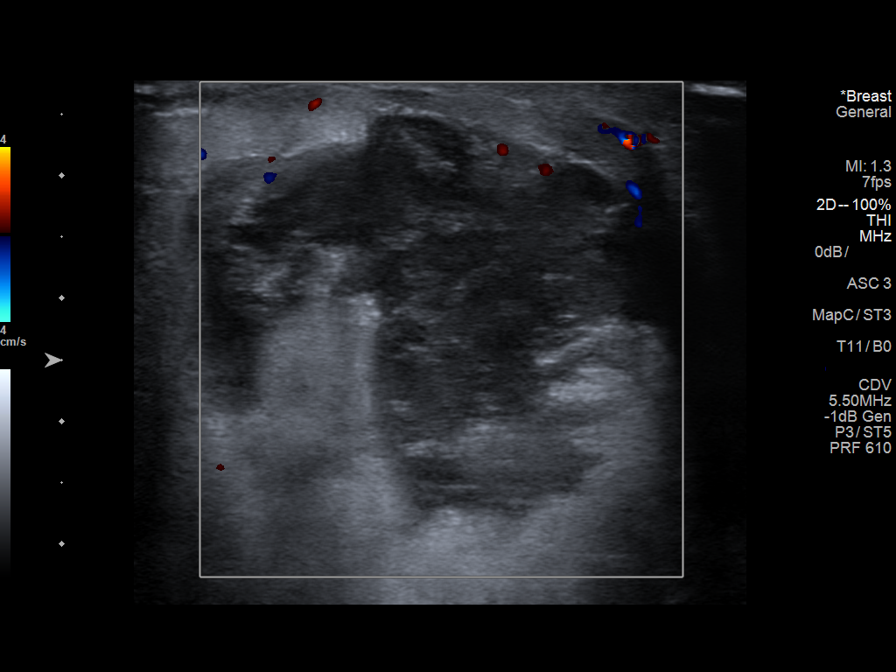
[im 5/9]
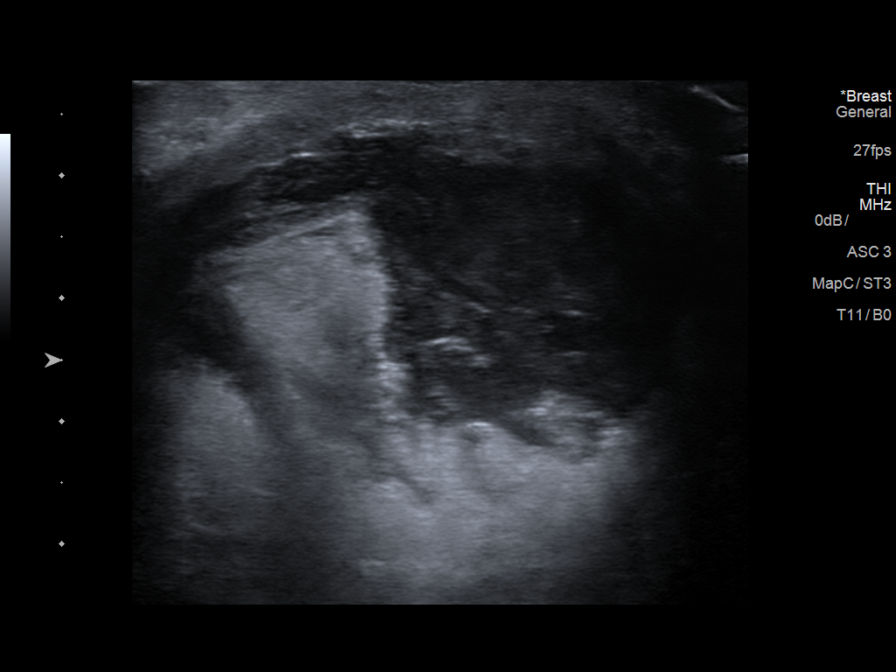
[im 6/9]
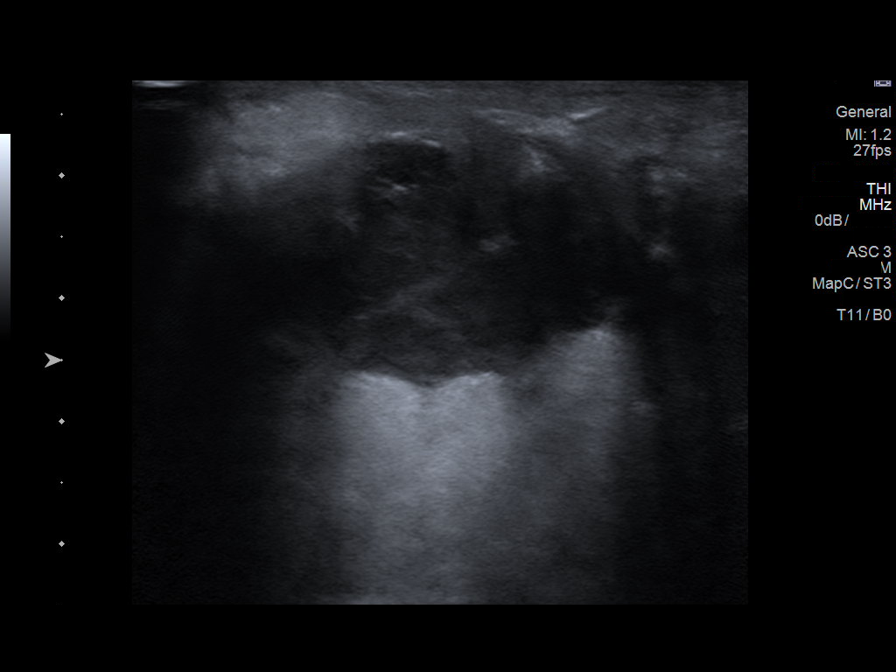
[im 7/9]
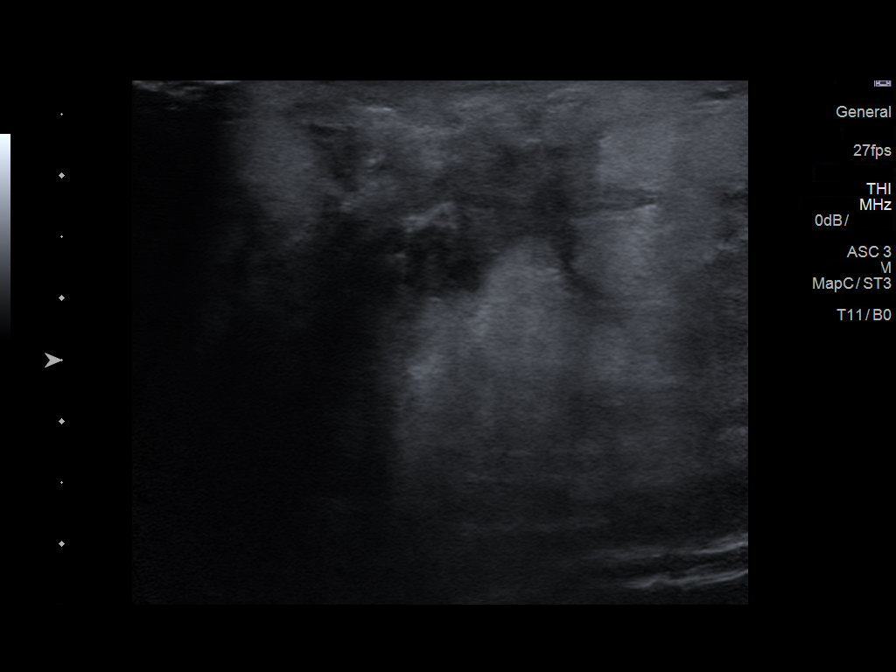
[im 8/9]
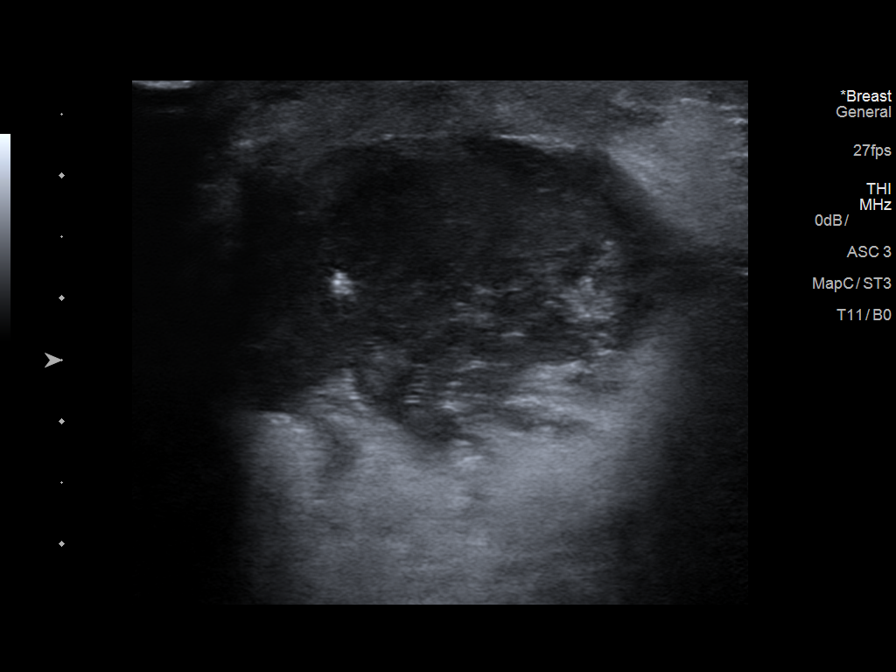
[im 9/9]
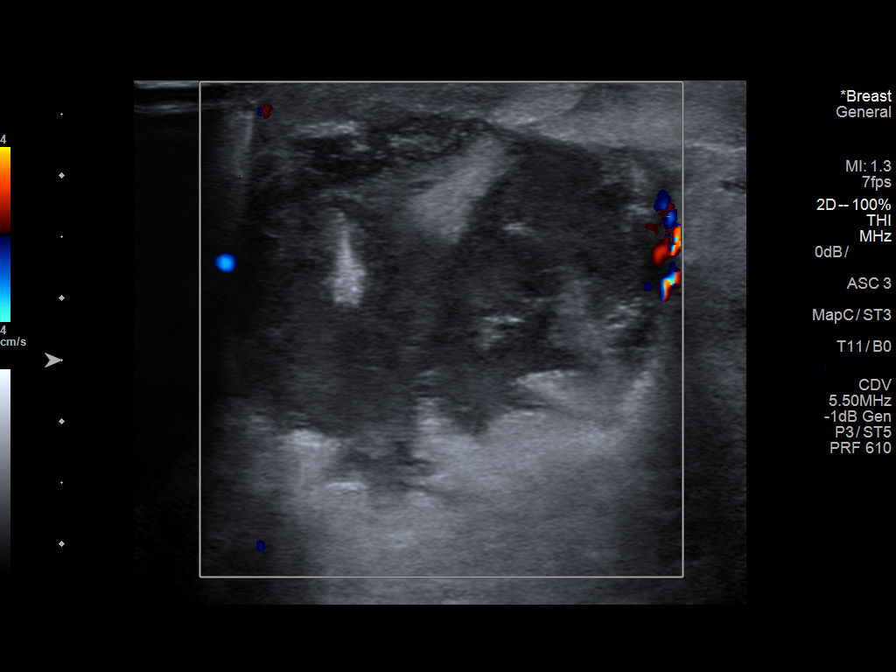

[9 of 9 positions shown; findings below may reference images not displayed]

FINDINGS: On physical exam, swelling and erythema are noted at and about the
patient's subareolar region.

Targeted ultrasound is performed, showing a complex collection of
fluid deep to the nipple, measuring 4.7 x 2.9 x 4.0 cm. Mild
surrounding soft tissue edema is noted. No significant hyperemia is
seen at this time.
IMPRESSION: 4.7 x 2.9 x 4.0 cm abscess noted deep to the right nipple, with mild
surrounding soft tissue edema.

BI-RADS CATEGORY  2

These results were called by telephone at the time of interpretation
acknowledged these results.
